# Patient Record
Sex: Female | Born: 1970
Health system: Southern US, Community
[De-identification: ages and names within clinical notes are randomized; demographics above are authoritative.]

## PROBLEM LIST (undated history)

## (undated) DIAGNOSIS — E785 Hyperlipidemia, unspecified: Secondary | ICD-10-CM

## (undated) DIAGNOSIS — R011 Cardiac murmur, unspecified: Secondary | ICD-10-CM

## (undated) HISTORY — DX: Cardiac murmur, unspecified: R01.1

## (undated) HISTORY — DX: Hyperlipidemia, unspecified: E78.5

---
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
## 1995-10-04 HISTORY — PX: TUBAL LIGATION: SHX77

## 1997-10-03 HISTORY — PX: TONSILLECTOMY: SUR1361

## 2004-04-01 ENCOUNTER — Ambulatory Visit (HOSPITAL_COMMUNITY): Admission: RE | Admit: 2004-04-01 | Discharge: 2004-04-01 | Payer: Self-pay | Admitting: Pulmonary Disease

## 2005-03-02 ENCOUNTER — Ambulatory Visit (HOSPITAL_COMMUNITY): Admission: RE | Admit: 2005-03-02 | Discharge: 2005-03-02 | Payer: Self-pay | Admitting: Pulmonary Disease

## 2007-04-07 ENCOUNTER — Emergency Department (HOSPITAL_COMMUNITY): Admission: EM | Admit: 2007-04-07 | Discharge: 2007-04-07 | Payer: Self-pay | Admitting: Emergency Medicine

## 2007-10-21 ENCOUNTER — Emergency Department (HOSPITAL_COMMUNITY): Admission: EM | Admit: 2007-10-21 | Discharge: 2007-10-21 | Payer: Self-pay | Admitting: Emergency Medicine

## 2008-10-14 ENCOUNTER — Emergency Department (HOSPITAL_COMMUNITY): Admission: EM | Admit: 2008-10-14 | Discharge: 2008-10-14 | Payer: Self-pay | Admitting: Emergency Medicine

## 2011-07-19 LAB — BASIC METABOLIC PANEL
BUN: 8
Calcium: 9.1
Chloride: 105
GFR calc Af Amer: >60
Potassium: 3.8
Sodium: 139

## 2011-07-19 LAB — POCT CARDIAC MARKERS
CKMB, poc: <1 — ABNORMAL LOW
Myoglobin, poc: 43.9
Operator id: 198161
Troponin i, poc: <0.05

## 2011-07-19 LAB — CBC
Hemoglobin: 12.2
MCHC: 35.2
RBC: 3.97
WBC: 7.1

## 2011-07-19 LAB — DIFFERENTIAL
Basophils Absolute: 0
Eosinophils Relative: 1
Lymphocytes Relative: 26
Lymphs Abs: 1.8

## 2015-08-20 ENCOUNTER — Encounter: Payer: Self-pay | Admitting: Cardiology

## 2015-08-20 ENCOUNTER — Ambulatory Visit (INDEPENDENT_AMBULATORY_CARE_PROVIDER_SITE_OTHER): Payer: PRIVATE HEALTH INSURANCE | Admitting: Cardiology

## 2015-08-20 VITALS — BP 127/83 | HR 69 | Ht 67.0 in | Wt 171.0 lb

## 2015-08-20 DIAGNOSIS — R0789 Other chest pain: Secondary | ICD-10-CM | POA: Diagnosis not present

## 2015-08-20 DIAGNOSIS — R011 Cardiac murmur, unspecified: Secondary | ICD-10-CM | POA: Diagnosis not present

## 2015-08-20 NOTE — Progress Notes (Signed)
Patient ID: Kelly Buchanan, female   DOB: 1970-11-10, 44 y.o.   MRN: [id number] .      Clinical Summary Kelly Buchanan is a 44 y.o.female seen today as a new patient for the following medical problems.   1. Chest pain - symptoms started about 1 year ago - pressure like pain left chest, can radiate down into arm. No associated symptoms. 5/10 in severity. Typically occurs with stress, not neccesarily exertional. Not exertional, no relation to food - occurs 2-3 times a week. Can last several hours and constant - notes some mild DOE with walking up stairs - no  LE edema, no orthopnea, no PND - no change in severity or frequency  CAD: HL. Father MI in 75s   [place] 1. Hyperlipidemia   No Known Allergies   No current outpatient prescriptions on file.   No current facility-administered medications for this visit.     Past Surgical History  Procedure Laterality Date  . Appendectomy  1980's  . Tonsillectomy  1999  . Tubal ligation  1997     No Known Allergies    Family History  Problem Relation Age of Onset  . Hypertension Mother   . Heart disease Father   . Heart attack Father   . Hypertension Father      Social History Kelly Buchanan reports that she has never smoked. She does not have any smokeless tobacco history on file. Kelly Buchanan has no alcohol history on file.   Review of Systems CONSTITUTIONAL: No weight loss, fever, chills, weakness or fatigue.  HEENT: Eyes: No visual loss, blurred vision, double vision or yellow sclerae.No hearing loss, sneezing, congestion, runny nose or sore throat.  SKIN: No rash or itching.  CARDIOVASCULAR: per HPI RESPIRATORY: No cough or sputum.  GASTROINTESTINAL: No anorexia, nausea, vomiting or diarrhea. No abdominal pain or blood.  GENITOURINARY: No burning on urination, no polyuria NEUROLOGICAL: No headache, dizziness, syncope, paralysis, ataxia, numbness or tingling in the extremities. No change in bowel or bladder control.    MUSCULOSKELETAL: No muscle, back pain, joint pain or stiffness.  LYMPHATICS: No enlarged nodes. No history of splenectomy.  PSYCHIATRIC: No history of depression or anxiety.  ENDOCRINOLOGIC: No reports of sweating, cold or heat intolerance. No polyuria or polydipsia.  Marland Kitchen   Physical Examination Filed Vitals:   08/20/15 1311  BP: 127/83  Pulse: 69   Filed Weights   08/20/15 1311  Weight: 171 lb (77.565 kg)    Gen: resting comfortably, no acute distress HEENT: no scleral icterus, pupils equal round and reactive, no palptable cervical adenopathy,  CV: RRR, 2/6 systolic murmur RUSB, no jvd Resp: Clear to auscultation bilaterally GI: abdomen is soft, non-tender, non-distended, normal bowel sounds, no hepatosplenomegaly MSK: extremities are warm, no edema. Left chestwall tender to palpation  Skin: warm, no rash Neuro:  no focal deficits Psych: appropriate affect   Diagnostic Studies EKG: NSR    Assessment and Plan  1. Chest pain - atypical symptoms in a young patient with limited CAD risk factors. Left chestwall is tender to palpation, suspect costochondritis - will take ibuprofen 400mg  tid x 7 days  2. Heart murmur - will obtain echo      Arnoldo Lenis, M.D.

## 2015-08-20 NOTE — Patient Instructions (Signed)
Your physician recommends that you schedule a follow-up appointment in: 2 months with Dr. Harl Bowie  Your physician has recommended you make the following change in your medication:   Kelly Buchanan physician has requested that you have an echocardiogram. Echocardiography is a painless test that uses sound waves to create images of your heart. It provides your doctor with information about the size and shape of your heart and how well your heart's chambers and valves are working. This procedure takes approximately one hour. There are no restrictions for this procedure.  Thank you for choosing [facility]!!

## 2015-09-02 ENCOUNTER — Other Ambulatory Visit: Payer: PRIVATE HEALTH INSURANCE

## 2015-09-17 ENCOUNTER — Ambulatory Visit (INDEPENDENT_AMBULATORY_CARE_PROVIDER_SITE_OTHER): Payer: PRIVATE HEALTH INSURANCE

## 2015-09-17 ENCOUNTER — Other Ambulatory Visit: Payer: Self-pay

## 2015-09-17 DIAGNOSIS — R011 Cardiac murmur, unspecified: Secondary | ICD-10-CM | POA: Diagnosis not present

## 2015-11-03 ENCOUNTER — Encounter: Payer: Self-pay | Admitting: *Deleted

## 2015-11-03 ENCOUNTER — Ambulatory Visit: Payer: PRIVATE HEALTH INSURANCE | Admitting: Cardiology

## 2015-11-03 NOTE — Progress Notes (Unsigned)
Patient ID: Kelly Buchanan, female   DOB: Oct 13, 1970, 45 y.o.   MRN: [id number]     Clinical Summary Ms. Facenda is a 45 y.o.female seen today for follow up of the following medical problems.   1. Chest pain - symptoms started about 1 year ago - pressure like pain left chest, can radiate down into arm. No associated symptoms. 5/10 in severity. Typically occurs with stress, not neccesarily exertional. Not exertional, no relation to food - occurs 2-3 times a week. Can last several hours and constant - notes some mild DOE with walking up stairs - no LE edema, no orthopnea, no PND - no change in severity or frequency  CAD: HL. Father MI in 69s      [place] 1. Hyperlipidemia Past Medical History  Diagnosis Date  . Hyperlipidemia   . Murmur      No Known Allergies   No current outpatient prescriptions on file.   No current facility-administered medications for this visit.     Past Surgical History  Procedure Laterality Date  . Appendectomy  1980's  . Tonsillectomy  1999  . Tubal ligation  1997     No Known Allergies    Family History  Problem Relation Age of Onset  . Hypertension Mother   . Heart disease Father   . Heart attack Father   . Hypertension Father      Social History Ms. Lynes reports that she has never smoked. She does not have any smokeless tobacco history on file. Ms. Shahzad has no alcohol history on file.   Review of Systems CONSTITUTIONAL: No weight loss, fever, chills, weakness or fatigue.  HEENT: Eyes: No visual loss, blurred vision, double vision or yellow sclerae.No hearing loss, sneezing, congestion, runny nose or sore throat.  SKIN: No rash or itching.  CARDIOVASCULAR:  RESPIRATORY: No shortness of breath, cough or sputum.  GASTROINTESTINAL: No anorexia, nausea, vomiting or diarrhea. No abdominal pain or blood.  GENITOURINARY: No burning on urination, no polyuria NEUROLOGICAL: No headache, dizziness, syncope, paralysis, ataxia,  numbness or tingling in the extremities. No change in bowel or bladder control.  MUSCULOSKELETAL: No muscle, back pain, joint pain or stiffness.  LYMPHATICS: No enlarged nodes. No history of splenectomy.  PSYCHIATRIC: No history of depression or anxiety.  ENDOCRINOLOGIC: No reports of sweating, cold or heat intolerance. No polyuria or polydipsia.  Marland Kitchen   Physical Examination There were no vitals filed for this visit. There were no vitals filed for this visit.  Gen: resting comfortably, no acute distress HEENT: no scleral icterus, pupils equal round and reactive, no palptable cervical adenopathy,  CV Resp: Clear to auscultation bilaterally GI: abdomen is soft, non-tender, non-distended, normal bowel sounds, no hepatosplenomegaly MSK: extremities are warm, no edema.  Skin: warm, no rash Neuro:  no focal deficits Psych: appropriate affect   Diagnostic Studies 09/2015 echo  Study Conclusions  - Left ventricle: The cavity size was normal. Wall thickness was normal. Systolic function was normal. The estimated ejection fraction was in the range of 60% to 65%. Wall motion was normal; there were no regional wall motion abnormalities. Left ventricular diastolic function parameters were normal. - Tricuspid valve: There was mild regurgitation.   Assessment and Plan   1. Chest pain - atypical symptoms in a young patient with limited CAD risk factors. Left chestwall is tender to palpation, suspect costochondritis - will take ibuprofen 400mg  tid x 7 days  2. Heart murmur - will obtain echo     Arnoldo Lenis,  M.D., F.A.C.C.

## 2015-11-23 ENCOUNTER — Ambulatory Visit: Payer: PRIVATE HEALTH INSURANCE | Admitting: Cardiology

## 2015-12-14 ENCOUNTER — Ambulatory Visit (INDEPENDENT_AMBULATORY_CARE_PROVIDER_SITE_OTHER): Payer: PRIVATE HEALTH INSURANCE | Admitting: Cardiology

## 2015-12-14 ENCOUNTER — Encounter: Payer: Self-pay | Admitting: Cardiology

## 2015-12-14 VITALS — BP 126/80 | HR 67 | Ht 67.0 in | Wt 178.0 lb

## 2015-12-14 DIAGNOSIS — R011 Cardiac murmur, unspecified: Secondary | ICD-10-CM | POA: Diagnosis not present

## 2015-12-14 DIAGNOSIS — R0789 Other chest pain: Secondary | ICD-10-CM | POA: Diagnosis not present

## 2015-12-14 MED ORDER — IBUPROFEN 400 MG PO TABS: 400.0000 mg | ORAL_TABLET | Freq: Three times a day (TID) | ORAL | Status: DC

## 2015-12-14 NOTE — Patient Instructions (Signed)
   Ibuprofen 400mg  three times per day x 7 days  Continue all other medications.   Your physician wants you to follow up in: 6 months.  You will receive a reminder letter in the mail one-two months in advance.  If you don't receive a letter, please call our office to schedule the follow up appointment

## 2015-12-14 NOTE — Progress Notes (Signed)
Patient ID: Kelly Buchanan, female   DOB: Jul 08, 1971, 45 y.o.   MRN: [id number]     Clinical Summary Ms. Huicochea is a 45 y.o.female seen today for follow up of the following medical problems.   1. Chest pain - last visit described symptoms of chest pain that started about 1 year ago - pressure like pain left chest, can radiate down into arm. No associated symptoms. 5/10 in severity. Typically occurs with stress, not neccesarily exertional. Not exertional, no relation to food - occurs 2-3 times a week. Can last several hours and constant. Last visit was reproducible to palpation.  CAD risk factors: hyperlipidemia  - since last visit symptoms are unchanged. She did not try a course of NSAIDs as recommended.    2. Heart murmur - since last visit complete echo that only showed mild TR - no SOB, no LE edema.    SH: Works as Engineer, structural in [place] History  Diagnosis Date  . Hyperlipidemia   . Murmur      No Known Allergies   No current outpatient prescriptions on file.   No current facility-administered medications for this visit.     Past Surgical History  Procedure Laterality Date  . Appendectomy  1980's  . Tonsillectomy  1999  . Tubal ligation  1997     No Known Allergies    Family History  Problem Relation Age of Onset  . Hypertension Mother   . Heart disease Father   . Heart attack Father   . Hypertension Father      Social History Ms. Vanderkamp reports that she has never smoked. She does not have any smokeless tobacco history on file. Ms. Weichert has no alcohol history on file.   Review of Systems CONSTITUTIONAL: No weight loss, fever, chills, weakness or fatigue.  HEENT: Eyes: No visual loss, blurred vision, double vision or yellow sclerae.No hearing loss, sneezing, congestion, runny nose or sore throat.  SKIN: No rash or itching.  CARDIOVASCULAR: per HPI RESPIRATORY: No shortness of breath, cough or sputum.  GASTROINTESTINAL: No  anorexia, nausea, vomiting or diarrhea. No abdominal pain or blood.  GENITOURINARY: No burning on urination, no polyuria NEUROLOGICAL: No headache, dizziness, syncope, paralysis, ataxia, numbness or tingling in the extremities. No change in bowel or bladder control.  MUSCULOSKELETAL: No muscle, back pain, joint pain or stiffness.  LYMPHATICS: No enlarged nodes. No history of splenectomy.  PSYCHIATRIC: No history of depression or anxiety.  ENDOCRINOLOGIC: No reports of sweating, cold or heat intolerance. No polyuria or polydipsia.  Marland Kitchen   Physical Examination Filed Vitals:   12/14/15 0846  BP: 126/80  Pulse: 67   Filed Vitals:   12/14/15 0846  Height: 5\' 7"  (1.702 m)  Weight: 178 lb (80.74 kg)    Gen: resting comfortably, no acute distress HEENT: no scleral icterus, pupils equal round and reactive, no palptable cervical adenopathy,  CV: RRR, 2/6 systolic murmur LLSB, no jvd Resp: Clear to auscultation bilaterally GI: abdomen is soft, non-tender, non-distended, normal bowel sounds, no hepatosplenomegaly MSK: extremities are warm, no edema.  Skin: warm, no rash Neuro:  no focal deficits Psych: appropriate affect   Diagnostic Studies  09/2015 echo Study Conclusions  - Left ventricle: The cavity size was normal. Wall thickness was  normal. Systolic function was normal. The estimated ejection  fraction was in the range of 60% to 65%. Wall motion was normal;  there were no regional wall motion abnormalities. Left  ventricular diastolic function parameters were normal. -  Tricuspid valve: There was mild regurgitation.   Assessment and Plan  1. Chest pain - atypical symptoms in a young patient with limited CAD risk factors. Last visit pain was reproducible to palpation. Likely MSK pain,she did not try a course of NSAIDs as recommended. Continue to recommend ibuprofen 400mg  tid x 7 days - no further cardiac workup at this time.   2. Heart murmur - echo with fairly benign  finding of mild TR, continue to monitor.    F/u 6 months   Arnoldo Lenis, M.D.

## 2016-01-27 ENCOUNTER — Other Ambulatory Visit (HOSPITAL_COMMUNITY): Payer: Self-pay | Admitting: Family

## 2016-01-27 DIAGNOSIS — Z1231 Encounter for screening mammogram for malignant neoplasm of breast: Secondary | ICD-10-CM

## 2016-02-01 ENCOUNTER — Ambulatory Visit (HOSPITAL_COMMUNITY)
Admission: RE | Admit: 2016-02-01 | Discharge: 2016-02-01 | Disposition: A | Discharge status: Home / Self Care | Payer: PRIVATE HEALTH INSURANCE | Source: Ambulatory Visit | Attending: Family | Admitting: Family

## 2016-02-01 DIAGNOSIS — Z1231 Encounter for screening mammogram for malignant neoplasm of breast: Secondary | ICD-10-CM | POA: Diagnosis not present

## 2016-02-18 ENCOUNTER — Ambulatory Visit (INDEPENDENT_AMBULATORY_CARE_PROVIDER_SITE_OTHER): Payer: PRIVATE HEALTH INSURANCE | Admitting: Podiatry

## 2016-02-18 ENCOUNTER — Encounter: Payer: Self-pay | Admitting: Podiatry

## 2016-02-18 ENCOUNTER — Ambulatory Visit (INDEPENDENT_AMBULATORY_CARE_PROVIDER_SITE_OTHER): Payer: PRIVATE HEALTH INSURANCE

## 2016-02-18 VITALS — BP 113/75 | HR 68 | Resp 16 | Ht 67.0 in | Wt 175.0 lb

## 2016-02-18 DIAGNOSIS — M79672 Pain in left foot: Secondary | ICD-10-CM | POA: Diagnosis not present

## 2016-02-18 DIAGNOSIS — M722 Plantar fascial fibromatosis: Secondary | ICD-10-CM | POA: Diagnosis not present

## 2016-02-18 DIAGNOSIS — L309 Dermatitis, unspecified: Secondary | ICD-10-CM | POA: Diagnosis not present

## 2016-02-18 MED ORDER — TERBINAFINE HCL 250 MG PO TABS: 250.0000 mg | ORAL_TABLET | Freq: Every day | ORAL | Status: DC

## 2016-02-18 MED ORDER — TRIAMCINOLONE ACETONIDE 10 MG/ML IJ SUSP
10.0000 mg | Freq: Once | INTRAMUSCULAR | Status: AC
  Administered 2016-02-18: 10 mg

## 2016-02-18 NOTE — Progress Notes (Signed)
   Subjective:    Patient ID: Kelly Buchanan, female    DOB: Feb 12, 1971, 45 y.o.   MRN: [id number]  HPI Chief Complaint  Patient presents with  . Foot Pain    Left foot; arch; x1 yr  . Skin Condition    Bilateral; pt stated, "Has dry scaly skin; mainly more on Left foot"; x1 yr      Review of Systems  All other systems reviewed and are negative.      Objective:   Physical Exam        Assessment & Plan:

## 2016-02-18 NOTE — Progress Notes (Signed)
Subjective:     Patient ID: Kelly Buchanan, female   DOB: 1970/12/21, 45 y.o.   MRN: [id number]  HPI patient points to left arch states it's been bothering her for several months and also has discoloration of the left plantar foot with dry skin with minimal on the right foot   Review of Systems  All other systems reviewed and are negative.      Objective:   Physical Exam  Constitutional: She is oriented to person, place, and time.  Cardiovascular: Intact distal pulses.   Musculoskeletal: Normal range of motion.  Neurological: She is oriented to person, place, and time.  Skin: Skin is warm.  Nursing note and vitals reviewed.  neurovascular status intact muscle strength adequate with patient found to have exquisite mid arch pain left arch with fluid buildup within the mid arch area. The patient's left foot has significant dry skin plantarly that's localized in nature with minimal on the right and does not remember specific problem that may have occurred. Good digital perfusion well oriented 3     Assessment:     Possibility for fungus type treatment or dermatitis along with inflammatory pain in the left plantar fascia mid arch    Plan:     H&P and x-rays reviewed. Injected mid arch left 3 mg Kenalog 5 mill grams Xylocaine advised on Vaseline Saran wrap usage and placed on oral Lamisil to her 50 mg 45 days with instructions to stop and if it should cause any issues  Raise indicated that there is a well-developed arch with no spurring or signs of stress fracture

## 2016-02-18 NOTE — Patient Instructions (Signed)

## 2016-03-11 ENCOUNTER — Ambulatory Visit (INDEPENDENT_AMBULATORY_CARE_PROVIDER_SITE_OTHER): Payer: PRIVATE HEALTH INSURANCE | Admitting: Podiatry

## 2016-03-11 ENCOUNTER — Encounter: Payer: Self-pay | Admitting: Podiatry

## 2016-03-11 VITALS — BP 121/76 | HR 62 | Resp 12

## 2016-03-11 DIAGNOSIS — M722 Plantar fascial fibromatosis: Secondary | ICD-10-CM | POA: Diagnosis not present

## 2016-03-11 NOTE — Progress Notes (Signed)
Subjective:     Patient ID: Kelly Buchanan, female   DOB: 02/14/1971, 45 y.o.   MRN: [id number]  HPI patient states my left arch is feeling quite a bit better with mild discomfort still noted and I still have dry skin   Review of Systems     Objective:   Physical Exam Neurovascular status intact muscle strength adequate with continued dry skin formation and improvement of the left arch with palpation with slight pain still noted upon deep palpation    Assessment:     Probable fungal infection still noted with significant improvement of the plantar fascial left    Plan:     Advised on physical therapy anti-inflammatories and went ahead and discussed shoe gear modifications. Continue oral Lamisil and will be seen back as needed

## 2016-09-05 ENCOUNTER — Ambulatory Visit: Payer: PRIVATE HEALTH INSURANCE | Admitting: Family Medicine

## 2016-09-06 ENCOUNTER — Ambulatory Visit (INDEPENDENT_AMBULATORY_CARE_PROVIDER_SITE_OTHER): Payer: PRIVATE HEALTH INSURANCE | Admitting: Family Medicine

## 2016-09-06 ENCOUNTER — Encounter: Payer: Self-pay | Admitting: Family Medicine

## 2016-09-06 VITALS — BP 134/86 | HR 72 | Temp 98.8°F | Resp 16 | Ht 67.0 in | Wt 176.0 lb

## 2016-09-06 DIAGNOSIS — L74510 Primary focal hyperhidrosis, axilla: Secondary | ICD-10-CM

## 2016-09-06 DIAGNOSIS — E785 Hyperlipidemia, unspecified: Secondary | ICD-10-CM | POA: Diagnosis not present

## 2016-09-06 DIAGNOSIS — Z23 Encounter for immunization: Secondary | ICD-10-CM | POA: Diagnosis not present

## 2016-09-06 DIAGNOSIS — R011 Cardiac murmur, unspecified: Secondary | ICD-10-CM | POA: Diagnosis not present

## 2016-09-06 DIAGNOSIS — Z7689 Persons encountering health services in other specified circumstances: Secondary | ICD-10-CM

## 2016-09-06 HISTORY — DX: Primary focal hyperhidrosis, axilla: L74.510

## 2016-09-06 LAB — CBC
HCT: 38.5 % (ref 35.0–45.0)
Hemoglobin: 13 g/dL (ref 11.7–15.5)
MCH: 29.8 pg (ref 27.0–33.0)
MCHC: 33.8 g/dL (ref 32.0–36.0)
MCV: 88.3 fL (ref 80.0–100.0)
MPV: 8.3 fL (ref 7.5–12.5)
PLATELETS: 314 10*3/uL (ref 140–400)
RBC: 4.36 MIL/uL (ref 3.80–5.10)
RDW: 13.8 % (ref 11.0–15.0)
WBC: 5.2 10*3/uL (ref 3.8–10.8)

## 2016-09-06 LAB — COMPLETE METABOLIC PANEL WITH GFR
ALK PHOS: 125 U/L — AB (ref 33–115)
ALT: 14 U/L (ref 6–29)
AST: 13 U/L (ref 10–30)
Albumin: 4.4 g/dL (ref 3.6–5.1)
BUN: 9 mg/dL (ref 7–25)
CHLORIDE: 103 mmol/L (ref 98–110)
CO2: 29 mmol/L (ref 20–31)
Calcium: 9.3 mg/dL (ref 8.6–10.2)
Creat: 0.76 mg/dL (ref 0.50–1.10)
GFR, Est African American: >89 mL/min (ref >=60)
GLUCOSE: 84 mg/dL (ref 65–99)
POTASSIUM: 3.7 mmol/L (ref 3.5–5.3)
SODIUM: 138 mmol/L (ref 135–146)
Total Bilirubin: 0.5 mg/dL (ref 0.2–1.2)
Total Protein: 7.1 g/dL (ref 6.1–8.1)

## 2016-09-06 LAB — LIPID PANEL
CHOL/HDL RATIO: 4.1 ratio (ref <5.0)
Cholesterol: 224 mg/dL — ABNORMAL HIGH (ref <200)
HDL: 55 mg/dL (ref >50)
LDL CALC: 147 mg/dL — AB (ref <100)
Triglycerides: 109 mg/dL (ref <150)
VLDL: 22 mg/dL (ref <30)

## 2016-09-06 LAB — URINALYSIS, ROUTINE W REFLEX MICROSCOPIC
BILIRUBIN URINE: NEGATIVE
Glucose, UA: NEGATIVE
Hgb urine dipstick: NEGATIVE
KETONES UR: NEGATIVE
Leukocytes, UA: NEGATIVE
Nitrite: NEGATIVE
Protein, ur: NEGATIVE
SPECIFIC GRAVITY, URINE: 1.017 (ref 1.001–1.035)
pH: 5 (ref 5.0–8.0)

## 2016-09-06 MED ORDER — ALUMINUM CHLORIDE 20 % EX SOLN: Freq: Every day | CUTANEOUS | 0 refills | Status: DC

## 2016-09-06 NOTE — Patient Instructions (Signed)
Get labs today I will send you a letter with your test results.  If there is anything of concern, we will call right away.  Continue to eat well and exercise  See me yearly

## 2016-09-06 NOTE — Progress Notes (Signed)
Chief Complaint  Patient presents with  . Establish Care   New to establish Pap and mammogram this year Sees eye doctor every year Dentist every 6 months Works as Engineer, structural No health concerns  Patient Active Problem List   Diagnosis Date Noted  . Heart murmur 09/06/2016  . Hyperlipidemia 09/06/2016  . Hyperhidrosis of axilla 09/06/2016    Outpatient Encounter Prescriptions as of 09/06/2016  Medication Sig  . aluminum chloride (DRYSOL) 20 % external solution Apply topically at bedtime.  . [DISCONTINUED] ibuprofen (ADVIL,MOTRIN) 400 MG tablet Take 1 tablet (400 mg total) by mouth 3 (three) times daily. X 7 days (Patient not taking: Reported on 09/06/2016)  . [DISCONTINUED] lovastatin (MEVACOR) 20 MG tablet Take 20 mg by mouth as needed.   . [DISCONTINUED] terbinafine (LAMISIL) 250 MG tablet Take 1 tablet (250 mg total) by mouth daily. (Patient not taking: Reported on 09/06/2016)   No facility-administered encounter medications on file as of 09/06/2016.     Past Medical History:  Diagnosis Date  . Hyperlipidemia   . Murmur     Past Surgical History:  Procedure Laterality Date  . APPENDECTOMY  1980's  . TONSILLECTOMY  1999  . TUBAL LIGATION  1997    Social History   Social History  . Marital status: Single    Spouse name: N/A  . Number of children: 4  . Years of education: 14   Occupational History  . police officer [employer]   Social History Main Topics  . Smoking status: Never Smoker  . Smokeless tobacco: Never Used  . Alcohol use No  . Drug use: No  . Sexual activity: Not Currently    Birth control/ protection: Surgical   Other Topics Concern  . Not on file   Social History Narrative   Lives with 10 yo daughter   police officer    Family History  Problem Relation Age of Onset  . Hypertension Mother   . Heart disease Father 57    died at 27  . Heart attack Father   . Hypertension Father   . Alcohol abuse Father   . Cancer Sister      hodgkins  . Cancer Maternal Aunt     Review of Systems  Constitutional: Negative for chills, fever and weight loss.  HENT: Negative for congestion and hearing loss.   Eyes: Negative for blurred vision and pain.  Respiratory: Negative for cough and shortness of breath.   Cardiovascular: Negative for chest pain and leg swelling.  Gastrointestinal: Negative for abdominal pain, constipation, diarrhea and heartburn.  Genitourinary: Negative for dysuria and frequency.  Musculoskeletal: Negative for falls, joint pain and myalgias.  Neurological: Negative for dizziness, seizures and headaches.  Psychiatric/Behavioral: Negative for depression. The patient is not nervous/anxious and does not have insomnia.     BP 134/86 (BP Location: Right Arm, Patient Position: Sitting, Cuff Size: Normal)   Pulse 72   Temp 98.8 F (37.1 C) (Oral)   Resp 16   Ht 5\' 7"  (1.702 m)   Wt 176 lb (79.8 kg)   LMP 09/02/2016 (Exact Date)   SpO2 99%   BMI 27.57 kg/m   Physical Exam  Constitutional: She is oriented to person, place, and time. She appears well-developed and well-nourished.  HENT:  Head: Normocephalic and atraumatic.  Right Ear: External ear normal.  Left Ear: External ear normal.  Mouth/Throat: Oropharynx is clear and moist.  Eyes: Conjunctivae are normal. Pupils are equal, round, and reactive to light.  Neck: Normal range of motion. Neck supple. No thyromegaly present.  Cardiovascular: Normal rate, regular rhythm and normal heart sounds.   No murmur  Pulmonary/Chest: Effort normal and breath sounds normal. No respiratory distress.  Abdominal: Soft. Bowel sounds are normal.  Musculoskeletal: Normal range of motion. She exhibits no edema.  Lymphadenopathy:    She has no cervical adenopathy.  Neurological: She is alert and oriented to person, place, and time.  Gait normal  Skin: Skin is warm and dry.  Psychiatric: She has a normal mood and affect. Her behavior is normal. Thought content  normal.  Nursing note and vitals reviewed.  ASSESSMENT/PLAN:  1. Heart murmur   2. Hyperlipidemia, unspecified hyperlipidemia type  - CBC - COMPLETE METABOLIC PANEL WITH GFR - Lipid panel - Urinalysis, Routine w reflex microscopic (not at [facility]) - VITAMIN D 25 Hydroxy (Vit-D Deficiency, Fractures)  3. Hyperhidrosis of axilla   4. Need for prophylactic vaccination and inoculation against influenza   5. Need for Tdap vaccination     Patient Instructions  Get labs today I will send you a letter with your test results.  If there is anything of concern, we will call right away.  Continue to eat well and exercise  See me yearly   Raylene Everts, MD

## 2016-09-07 ENCOUNTER — Encounter: Payer: Self-pay | Admitting: Family Medicine

## 2016-09-07 DIAGNOSIS — E559 Vitamin D deficiency, unspecified: Secondary | ICD-10-CM | POA: Insufficient documentation

## 2016-09-07 LAB — VITAMIN D 25 HYDROXY (VIT D DEFICIENCY, FRACTURES): VIT D 25 HYDROXY: 9 ng/mL — AB (ref 30–100)

## 2016-09-07 MED ORDER — VITAMIN D (ERGOCALCIFEROL) 1.25 MG (50000 UNIT) PO CAPS: 50000.0000 [IU] | ORAL_CAPSULE | ORAL | 0 refills | Status: DC

## 2016-12-26 ENCOUNTER — Ambulatory Visit (INDEPENDENT_AMBULATORY_CARE_PROVIDER_SITE_OTHER): Payer: PRIVATE HEALTH INSURANCE | Admitting: Family Medicine

## 2016-12-26 ENCOUNTER — Encounter: Payer: Self-pay | Admitting: Family Medicine

## 2016-12-26 VITALS — BP 136/86 | HR 80 | Temp 98.5°F | Resp 16 | Ht 67.0 in | Wt 180.1 lb

## 2016-12-26 DIAGNOSIS — E559 Vitamin D deficiency, unspecified: Secondary | ICD-10-CM | POA: Diagnosis not present

## 2016-12-26 DIAGNOSIS — R635 Abnormal weight gain: Secondary | ICD-10-CM | POA: Diagnosis not present

## 2016-12-26 DIAGNOSIS — R4586 Emotional lability: Secondary | ICD-10-CM | POA: Diagnosis not present

## 2016-12-26 DIAGNOSIS — N951 Menopausal and female climacteric states: Secondary | ICD-10-CM | POA: Diagnosis not present

## 2016-12-26 NOTE — Patient Instructions (Signed)
Walk every day that you are able Eat well balance diet Try to get regular sleep Call in for better in a couple of weeks I will call you with your results for the next step

## 2016-12-26 NOTE — Progress Notes (Signed)
Chief Complaint  Patient presents with  . mood changes  emotional labilty Fatigue Less enjoyment in life Cant leave the house Didn't go to work this weekend Cries all the time No change in weight or appetite No pain or headache or somatic symptom.  Regular menses. Never treated depression before Had alcoholic father who was abusive to mother, brother went to prison for drugs, she is a single mom of 4 girls, has a hard/demanding job as a Engineer, structural, Enough reason for dysthymia   Patient Active Problem List   Diagnosis Date Noted  . Vitamin D deficiency 09/07/2016  . Heart murmur 09/06/2016  . Hyperlipidemia 09/06/2016  . Hyperhidrosis of axilla 09/06/2016    Outpatient Encounter Prescriptions as of 12/26/2016  Medication Sig  . aluminum chloride (DRYSOL) 20 % external solution Apply topically at bedtime.  . Vitamin D, Ergocalciferol, (DRISDOL) 50000 units CAPS capsule Take 1 capsule (50,000 Units total) by mouth every 7 (seven) days.   No facility-administered encounter medications on file as of 12/26/2016.     No Known Allergies  Review of Systems  Constitutional: Negative for activity change, appetite change and unexpected weight change.  HENT: Negative for congestion, dental problem, postnasal drip and rhinorrhea.   Eyes: Negative for redness and visual disturbance.  Respiratory: Negative for cough and shortness of breath.   Cardiovascular: Negative for chest pain, palpitations and leg swelling.  Gastrointestinal: Negative for abdominal pain, constipation and diarrhea.  Genitourinary: Negative for difficulty urinating, frequency and menstrual problem.  Musculoskeletal: Negative for arthralgias and back pain.  Neurological: Negative for dizziness and headaches.  Psychiatric/Behavioral: Positive for decreased concentration and dysphoric mood. Negative for self-injury, sleep disturbance and suicidal ideas. The patient is not nervous/anxious.     BP 136/86 (BP  Location: Right Arm, Patient Position: Sitting, Cuff Size: Normal)   Pulse 80   Temp 98.5 F (36.9 C) (Temporal)   Resp 16   Ht 5\' 7"  (1.702 m)   Wt 180 lb 1.9 oz (81.7 kg)   LMP 12/21/2016 (Exact Date)   SpO2 99%   BMI 28.21 kg/m   Physical Exam  Constitutional: She is oriented to person, place, and time. She appears well-developed and well-nourished.  HENT:  Head: Normocephalic and atraumatic.  Mouth/Throat: Oropharynx is clear and moist.  Eyes: Conjunctivae are normal. Pupils are equal, round, and reactive to light.  Neck: Normal range of motion. Neck supple. Thyromegaly present.  Cardiovascular: Normal rate, regular rhythm and normal heart sounds.   Pulmonary/Chest: Effort normal and breath sounds normal. No respiratory distress.  Musculoskeletal: Normal range of motion. She exhibits no edema.  Lymphadenopathy:    She has no cervical adenopathy.  Neurological: She is alert and oriented to person, place, and time. She displays normal reflexes.  Gait normal  Skin: Skin is warm and dry.  Psychiatric: Her behavior is normal. Thought content normal.  tearful  Nursing note and vitals reviewed.   ASSESSMENT/PLAN:  1. Lability emotional  - TSH - [facility] - LH - Vitamin D (25 hydroxy)  2. Weight gain finding  - TSH - [facility] - LH - Vitamin D (25 hydroxy)  3. Menopausal symptoms  - TSH - [facility] - LH - Vitamin D (25 hydroxy)  4. Vitamin D deficiency  - TSH - [facility] - LH - Vitamin D (25 hydroxy)  Discussed likely diagnosis of depression.  Discussed use of SSRI medicines and expectations.  Discussed sleep/ exercise/ eating well and caring for self.  Try yoga or meditation.  I will check labs and call her tomorrow.  Likely prescribe a SSRI. Patient Instructions  Walk every day that you are able Eat well balance diet Try to get regular sleep Call in for better in a couple of weeks I will call you with your results for the next step   Raylene Everts, MD

## 2016-12-27 ENCOUNTER — Other Ambulatory Visit: Payer: Self-pay | Admitting: Family Medicine

## 2016-12-27 LAB — TSH: TSH: 1.52 m[IU]/L

## 2016-12-27 LAB — VITAMIN D 25 HYDROXY (VIT D DEFICIENCY, FRACTURES): Vit D, 25-Hydroxy: 18 ng/mL — ABNORMAL LOW (ref 30–100)

## 2016-12-27 LAB — LUTEINIZING HORMONE: LH: 6.6 m[IU]/mL

## 2016-12-27 LAB — FOLLICLE STIMULATING HORMONE: FSH: 8.9 m[IU]/mL

## 2016-12-27 MED ORDER — DULOXETINE HCL 30 MG PO CPEP: 30.0000 mg | ORAL_CAPSULE | Freq: Every day | ORAL | 1 refills | Status: DC

## 2016-12-27 MED ORDER — VITAMIN D (ERGOCALCIFEROL) 1.25 MG (50000 UNIT) PO CAPS: 50000.0000 [IU] | ORAL_CAPSULE | ORAL | 0 refills | Status: DC

## 2017-04-25 ENCOUNTER — Ambulatory Visit (INDEPENDENT_AMBULATORY_CARE_PROVIDER_SITE_OTHER): Payer: PRIVATE HEALTH INSURANCE | Admitting: Family Medicine

## 2017-04-25 ENCOUNTER — Encounter: Payer: Self-pay | Admitting: Family Medicine

## 2017-04-25 ENCOUNTER — Ambulatory Visit (HOSPITAL_COMMUNITY)
Admission: RE | Admit: 2017-04-25 | Discharge: 2017-04-25 | Disposition: A | Discharge status: Home / Self Care | Payer: PRIVATE HEALTH INSURANCE | Source: Ambulatory Visit | Attending: Family Medicine | Admitting: Family Medicine

## 2017-04-25 VITALS — BP 126/72 | HR 84 | Temp 98.3°F | Resp 16 | Ht 67.0 in | Wt 180.1 lb

## 2017-04-25 DIAGNOSIS — M545 Low back pain: Secondary | ICD-10-CM | POA: Diagnosis present

## 2017-04-25 DIAGNOSIS — M4046 Postural lordosis, lumbar region: Secondary | ICD-10-CM | POA: Diagnosis not present

## 2017-04-25 DIAGNOSIS — G8929 Other chronic pain: Secondary | ICD-10-CM | POA: Diagnosis not present

## 2017-04-25 MED ORDER — NAPROXEN 500 MG PO TABS: 500.0000 mg | ORAL_TABLET | Freq: Two times a day (BID) | ORAL | 0 refills | Status: DC

## 2017-04-25 MED ORDER — CYCLOBENZAPRINE HCL 5 MG PO TABS: 5.0000 mg | ORAL_TABLET | Freq: Every day | ORAL | 1 refills | Status: DC

## 2017-04-25 NOTE — Progress Notes (Signed)
Chief Complaint  Patient presents with  . 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 Vitamin D deficiency 09/07/2016  . Heart murmur 09/06/2016  . Hyperlipidemia 09/06/2016  . Hyperhidrosis of axilla 09/06/2016    Outpatient Encounter Prescriptions as of 04/25/2017  Medication Sig  . cholecalciferol (VITAMIN D) 1000 units tablet Take 2,000 Units by mouth daily.  . cyclobenzaprine (FLEXERIL) 5 MG tablet Take 1 tablet (5 mg total) by mouth at bedtime. As needed muscle spasm  . naproxen (NAPROSYN) 500 MG tablet Take 1 tablet (500 mg total) by mouth 2 (two) times daily with a meal.   No facility-administered encounter medications on file as of 04/25/2017.     No Known Allergies  Review of Systems  Constitutional: Negative for activity change, appetite change and unexpected weight change.  HENT: Negative for congestion, dental problem, postnasal drip and rhinorrhea.   Eyes: Negative for redness and visual disturbance.  Respiratory: Negative for cough and shortness of breath.   Cardiovascular: Negative for chest pain, palpitations and leg swelling.  Gastrointestinal: Negative for abdominal pain, constipation and diarrhea.  Genitourinary: Negative for difficulty urinating, frequency and menstrual problem.  Musculoskeletal: Positive for back pain. Negative for arthralgias.  Neurological: Negative for dizziness and headaches.  Psychiatric/Behavioral: Negative for dysphoric mood and sleep disturbance. The patient is not nervous/anxious.     BP 126/72 (BP Location: Right Arm, Patient Position: Sitting, Cuff Size: Normal)    Pulse 84   Temp 98.3 F (36.8 C) (Temporal)   Resp 16   Ht 5\' 7"  (1.702 m)   Wt 180 lb 1.3 oz (81.7 kg)   LMP 04/18/2017 Comment: tubal ligation   SpO2 100%   BMI 28.20 kg/m   Physical Exam  Constitutional: She is oriented to person, place, and time. She appears well-developed and well-nourished.  HENT:  Head: Normocephalic and atraumatic.  Mouth/Throat: Oropharynx is clear and moist.  Eyes: Pupils are equal, round, and reactive to light. Conjunctivae are normal.  Neck: Normal range of motion. Neck supple. No thyromegaly present.  Cardiovascular: Normal rate, regular rhythm and normal heart sounds.   Pulmonary/Chest: Effort normal and breath sounds normal. No respiratory distress.  Abdominal: Soft. Bowel sounds are normal.  Musculoskeletal: Normal range of motion. She exhibits no edema.  Lumbar spine is straight and symmetric. Full range of motion. Mild midline tenderness lumbar and sacrum without muscle spasm. Strength, sensation, range of motion, and reflexes are normal in both lower extremities. Straight leg raise is negative bilateral.   Lymphadenopathy:    She has no cervical adenopathy.  Neurological: She is alert and oriented to person, place, and time.  Gait normal  Skin: Skin is warm and dry.  Psychiatric: She has a normal mood and affect. Her behavior is normal. Thought content normal.  Nursing note and vitals reviewed.   ASSESSMENT/PLAN:  1. Chronic midline low back pain without sciatica Likely mechanical back pian - DG Lumbar Spine Complete; Future - Ambulatory referral to Physical Therapy   Patient Instructions  Get your x rays today Start PT Take the naproxen twice a day Take the flexeril  as needed Ice or heat as needed Off work the rest of the week  See me in 2-3 weeks Call sooner for problems   Raylene Everts, MD

## 2017-04-25 NOTE — Patient Instructions (Addendum)
Get your x rays today Start PT Take the naproxen twice a day Take the flexeril as needed Ice or heat as needed Off work the rest of the week  See me in 2-3 weeks Call sooner for problems

## 2017-05-04 ENCOUNTER — Ambulatory Visit (HOSPITAL_COMMUNITY): Payer: PRIVATE HEALTH INSURANCE | Attending: Family Medicine

## 2017-05-04 ENCOUNTER — Encounter (HOSPITAL_COMMUNITY): Payer: Self-pay

## 2017-05-04 DIAGNOSIS — R293 Abnormal posture: Secondary | ICD-10-CM | POA: Diagnosis present

## 2017-05-04 DIAGNOSIS — M6281 Muscle weakness (generalized): Secondary | ICD-10-CM | POA: Diagnosis present

## 2017-05-04 DIAGNOSIS — M545 Low back pain: Secondary | ICD-10-CM | POA: Diagnosis present

## 2017-05-04 DIAGNOSIS — G8929 Other chronic pain: Secondary | ICD-10-CM | POA: Diagnosis present

## 2017-05-04 DIAGNOSIS — R29898 Other symptoms and signs involving the musculoskeletal system: Secondary | ICD-10-CM | POA: Diagnosis present

## 2017-05-04 NOTE — Patient Instructions (Signed)
  SINGLE KNEE TO CHEST STRETCH - [place]  While Lying on your back, hold your knee and gently pull it up towards your chest.  Perform 1x/day, 3-5 stretches holding for 30-60 seconds   DOUBLE KNEE TO CHEST STRETCH - DKTC  While Lying on your back,  hold your knees and gently pull them up towards your chest  Perform 1x/day, 3-5 stretches holding for 30-60 seconds   CHILD POSE - PRAYER STRETCH  While in a crawl position, slowly lower your buttocks towards your feet until a stretch is felt along your back and or buttocks.  Perform 1x/day, 3-5 stretches holding for 30-60 seconds   LOOPED ELASTIC BAND HIP ABDUCTION  While standing with an elastic band looped around your ankles, move the target leg out to the side as shown.  Perform 1x/day, 2-3 sets of 10 with green band

## 2017-05-04 NOTE — Therapy (Signed)
[facility] [place], [place], [address] Phone: [phone]   Fax:  [phone]  Physical Therapy Evaluation  Patient Details  Name: Kelly Buchanan MRN: [id number] Date of Birth: 1971-07-31 Referring Provider: Raylene Everts, MD  Encounter Date: 05/04/2017      PT End of Session - 05/04/17 1558    Visit Number 1   Number of Visits 13   Date for PT Re-Evaluation 06/15/17   Authorization Type [employer]   Authorization Time Period 05/04/17 to 06/01/17   PT Start Time 1518   PT Stop Time 1556   PT Time Calculation (min) 38 min   Activity Tolerance Patient tolerated treatment well;No increased pain   Behavior During Therapy WFL for tasks assessed/performed      Past Medical History:  Diagnosis Date  . Hyperlipidemia   . Murmur     Past Surgical History:  Procedure Laterality Date  . APPENDECTOMY  1980's  . TONSILLECTOMY  1999  . TUBAL LIGATION  1997    There were no vitals filed for this visit.       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 She's been having this LBP for 4-5 months but this most recent spasm was the worst. She states that her duty belt is at least 8-9#. She states that she had 1 bout of LLE radicular symptoms a few weeks ago, but she has not had it since. 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 She says that standing up from sitting down and squatting down aggravate her pain sometimes. Her medication and stretching help her feel better. She states her pain is worse after she has been up doing stuff for a long time.   Limitations Sitting   How long can you sit comfortably? 45 mins and has to get out of the car and stretch   How long  can you stand comfortably? 30 mins   How long can you walk comfortably? unsure, doesn't do much walking   Patient Stated Goals get back straight if she can   Currently in Pain? No/denies            [facility] PT Assessment - 05/04/17 0001      Assessment   Medical Diagnosis chronic mid-line LBP without sciatica   Referring Provider Raylene Everts, MD   Onset Date/Surgical Date 12/02/16   Next MD Visit --  no f/u   Prior Therapy none     Precautions   Precautions None     Restrictions   Weight Bearing Restrictions No     Balance Screen   Has the patient fallen in the past 6 months No   Has the patient had a decrease in activity level because of a fear of falling?  No   Is the patient reluctant to leave their home because of a fear of falling?  No     Prior Function   Level of Independence Independent     Observation/Other Assessments   Focus on Therapeutic Outcomes (FOTO)  51% limitation     Sensation   Light Touch Appears Intact     Posture/Postural Control   Posture/Postural Control  Postural limitations   Postural Limitations Rounded Shoulders;Increased thoracic kyphosis;Decreased lumbar lordosis     ROM / Strength   AROM / PROM / Strength AROM;Strength     AROM   AROM Assessment Site Lumbar   Lumbar Flexion WNL, non-painful   Lumbar Extension min limitations, pain at end range   Lumbar - Right Side Bend WNL, pulling on L   Lumbar - Left Side Bend WNL, pulling on R    Lumbar - Right Rotation WNL   Lumbar - Left Rotation WNL     Strength   Right Hip Flexion 5/5   Right Hip Extension 4/5   Right Hip ABduction 5/5   Left Hip Flexion 5/5   Left Hip Extension 4/5   Left Hip ABduction 5/5   Right Knee Flexion 5/5   Right Knee Extension 5/5   Left Knee Flexion 5/5   Left Knee Extension 5/5   Right Ankle Dorsiflexion 5/5   Left Ankle Dorsiflexion 5/5     Flexibility   Soft Tissue Assessment /Muscle Length yes   Hamstrings WNL, no LBP   Quadriceps L +  Ely's   Piriformis mild tightness BLE     Palpation   Spinal mobility L3-5 tender to Grade 2-3 CPAs, with L5 being the greatest, recreated pt's LBP; pt reported improved symptoms with prolonged bout at each segment   Palpation comment increased soft tissue restrictions and tenderness to palpation of bil lumbar paraspinals, L>R, recreated pt's LBP     Special Tests    Special Tests Leg LengthTest   Leg length test  True     True   Length inches   Right 37 in.   Left  37 in.   Comments negative     Ambulation/Gait   Ambulation Distance (Feet) 50 Feet  in clinic for gait analysis   Assistive device None   Gait Pattern Step-through pattern;Trendelenburg;Lateral trunk lean to left   Gait Comments Pt had noticeable trendelenberg LLE when in stance phase of gait, which caused her to have a lateral trunk lean to the L for compensation. Otherwise, gait WNL     Balance   Balance Assessed Yes     Static Standing Balance   Static Standing - Balance Support No upper extremity supported   Static Standing Balance -  Activities  Single Leg Stance - Right Leg;Single Leg Stance - Left Leg   Static Standing - Comment/# of Minutes R: 30 sec L: 2 sec or <         Objective measurements completed on examination: See above findings.          PT Education - 05/04/17 1558    Education provided Yes   Education Details exam findings, POC, HEP   Person(s) Educated Patient   Methods Explanation;Demonstration;Handout   Comprehension Verbalized understanding;Returned demonstration          PT Short Term Goals - 05/04/17 1621      PT SHORT TERM GOAL #1   Title Pt will be independent with HEP and perform consistently in order to maximize return to PLOF and decrease risk of reinjury.   Time 3   Period Weeks   Status New   Target Date 05/25/17     PT SHORT TERM GOAL #2   Title Pt will have improved lumbar ext AROM to WNL and without pain to maximize overall function.   Time 3   Period  Weeks   Status New     PT SHORT TERM  GOAL #3   Title Pt will have improved L hip IR to WNL to decrease pain and improve overall function.   Time 3   Period Weeks   Status New           PT Long Term Goals - 05/04/17 1625      PT LONG TERM GOAL #1   Title Pt will have improved tolerance to sitting to at least 2 hours with minimal to no pain in order to maximize work function.   Time 6   Period Weeks   Status New   Target Date 06/15/17     PT LONG TERM GOAL #2   Title Pt will be able to maintain L SLS for 15 sec or > to maximize gait.   Time 6   Period Weeks   Status New     PT LONG TERM GOAL #3   Title Pt will demonstrate improved functional strength of L hip abd as demo by a (-) Trendelenberg on the L during gait.    Time 6   Period Weeks   Status New                Plan - 05/04/17 1606    Clinical Impression Statement Pt is pleasant 46 YO F who presents to OPPT with c/o LBP without LE radicular symptoms which has been going on for 4-5 months. Pt currently presents with mild deficits in hip ext MMT, functional weakness of L hip abd as demo by +Trendelenberg during gait, increased soft tissue restrictions of lumbar paraspinals, and mild hypomobility of lumbar spine. She also has deficits in L hip IR as compared to the R, deficits in gait, balance and in sitting posture. Pt would benefit from skilled PT intervention to address these deficits in order to maximize overall function. PT feels pt is appropriate for 2x/week for 4 weeks, however, due to pt's work schedule, she stated she may not be able to make it to 2 sessions per week every week. Therefore, PT is making cert for 6 weeks to ensure that pt is able to attend enough sessions in order to fully benefit from skilled PT services.   History and Personal Factors relevant to plan of care: young, motivated, healthy, first time dealing with this issue   Clinical Presentation Stable   Clinical Presentation due to: MMT, L  SLS, trendelenberg on L, soft tissue restrictions, CPAs   Clinical Decision Making Low   Rehab Potential Good   PT Frequency 2x / week   PT Duration 6 weeks   PT Treatment/Interventions ADLs/Self Care Home Management;Cryotherapy;Electrical Stimulation;Moist Heat;Traction;Gait training;Stair training;Functional mobility training;Therapeutic activities;Therapeutic exercise;Balance training;Neuromuscular re-education;Patient/family education;Manual techniques;Passive range of motion;Dry needling;Energy conservation;Taping   PT Next Visit Plan review goals and HEP, quad stretching, add in lateral lean to child's pose, hip abd strengthening, hip stabilization work on foam, core strengthening/stabilization, manual to lumbar paraspinals, CPAs if with PT, postural strengthening and education on sitting with lumbar roll   PT Home Exercise Plan eval: SKTC, DKTC, child's pose, standing hip abd with GTB   Consulted and Agree with Plan of Care Patient      Patient will benefit from skilled therapeutic intervention in order to improve the following deficits and impairments:  Abnormal gait, Decreased balance, Decreased range of motion, Decreased strength, Difficulty walking, Hypomobility, Increased muscle spasms, Postural dysfunction, Pain  Visit Diagnosis: Chronic bilateral low back pain without sciatica - Plan: PT plan of care cert/re-cert  Muscle weakness (generalized) - Plan: PT  plan of care cert/re-cert  Abnormal posture - Plan: PT plan of care cert/re-cert  Other symptoms and signs involving the musculoskeletal system - Plan: PT plan of care cert/re-cert     Problem List Patient Active Problem List   Diagnosis Date Noted  . Vitamin D deficiency 09/07/2016  . Heart murmur 09/06/2016  . Hyperlipidemia 09/06/2016  . Hyperhidrosis of axilla 09/06/2016      Geraldine Solar PT, DPT  [facility] [address] [place], [place], [address] Phone:  [phone]   Fax:  [phone]  Name: Kelly Buchanan MRN: [id number] Date of Birth: Feb 16, 1971

## 2017-05-15 ENCOUNTER — Ambulatory Visit (HOSPITAL_COMMUNITY): Payer: PRIVATE HEALTH INSURANCE | Admitting: Physical Therapy

## 2017-05-15 DIAGNOSIS — M545 Low back pain: Secondary | ICD-10-CM | POA: Diagnosis not present

## 2017-05-15 DIAGNOSIS — R29898 Other symptoms and signs involving the musculoskeletal system: Secondary | ICD-10-CM

## 2017-05-15 DIAGNOSIS — R293 Abnormal posture: Secondary | ICD-10-CM

## 2017-05-15 DIAGNOSIS — G8929 Other chronic pain: Secondary | ICD-10-CM

## 2017-05-15 DIAGNOSIS — M6281 Muscle weakness (generalized): Secondary | ICD-10-CM

## 2017-05-15 NOTE — Therapy (Signed)
[place], [place], [address] Phone: [phone]   Fax:  [phone]  Physical Therapy Treatment  Patient Details  Name: Kelly Buchanan MRN: [id number] Date of Birth: 1971-04-20 Referring Provider: Raylene Everts, MD  Encounter Date: 05/15/2017      PT End of Session - 05/15/17 1728    Visit Number 2   Number of Visits 13   Date for PT Re-Evaluation 06/15/17   Authorization Type [employer]   Authorization Time Period 05/04/17 to 06/01/17   PT Start Time 1640   PT Stop Time 1728   PT Time Calculation (min) 48 min   Activity Tolerance Patient tolerated treatment well;No increased pain   Behavior During Therapy WFL for tasks assessed/performed      Past Medical History:  Diagnosis Date  . Hyperlipidemia   . Murmur     Past Surgical History:  Procedure Laterality Date  . APPENDECTOMY  1980's  . TONSILLECTOMY  1999  . TUBAL LIGATION  1997    There were no vitals filed for this visit.      Subjective Assessment - 05/15/17 1741    Subjective currently without pain. States she's been taking her pills at night if she needs them and has been waking up without pain.  Pt reports complaince with HEP.  States she forgot to bring her gun belt this session.    Currently in Pain? No/denies                         [facility] Adult PT Treatment/Exercise - 05/15/17 0001      Knee/Hip Exercises: Stretches   Piriformis Stretch Both;2 reps;30 seconds   Piriformis Stretch Limitations seated   Other Knee/Hip Stretches reveiwed HEP:  SKTC, DKTC, childs pose, standing hip ABD with green TB   Other Knee/Hip Stretches childs pose with lateral movements 5 reps 10 seconds     Knee/Hip Exercises: Supine   Bridges 10 reps   Other Supine Knee/Hip Exercises clams 10 reps each     Knee/Hip Exercises: Sidelying   Hip ABduction Both;10 reps     Manual Therapy   Manual Therapy Soft tissue mobilization   Manual  therapy comments completed seperately from all other skilled intervention   Soft tissue mobilization prone to bilateral lumbar paraspinals                PT Education - 05/15/17 1742    Education provided Yes   Education Details goals and HEP per evaluation   Person(s) Educated Patient   Methods Explanation;Demonstration;Tactile cues;Verbal cues;Handout   Comprehension Verbalized understanding;Returned demonstration;Verbal cues required;Tactile cues required;Need further instruction          PT Short Term Goals - 05/04/17 1621      PT SHORT TERM GOAL #1   Title Pt will be independent with HEP and perform consistently in order to maximize return to PLOF and decrease risk of reinjury.   Time 3   Period Weeks   Status New   Target Date 05/25/17     PT SHORT TERM GOAL #2   Title Pt will have improved lumbar ext AROM to WNL and without pain to maximize overall function.   Time 3   Period Weeks   Status New     PT SHORT TERM GOAL #3   Title Pt will have improved L hip IR to WNL to decrease pain and improve overall function.   Time 3  Period Weeks   Status New           PT Long Term Goals - 05/04/17 1625      PT LONG TERM GOAL #1   Title Pt will have improved tolerance to sitting to at least 2 hours with minimal to no pain in order to maximize work function.   Time 6   Period Weeks   Status New   Target Date 06/15/17     PT LONG TERM GOAL #2   Title Pt will be able to maintain L SLS for 15 sec or > to maximize gait.   Time 6   Period Weeks   Status New     PT LONG TERM GOAL #3   Title Pt will demonstrate improved functional strength of L hip abd as demo by a (-) Trendelenberg on the L during gait.    Time 6   Period Weeks   Status New               Plan - 05/15/17 1732    Clinical Impression Statement reviewed goals and HEP with patient who was able to demonstrate correctly.  Added additional therex as insttructed by evaluating PT.  Verbal  cues required with stab exercises to complete slow and controlled.   Pt reported good relief and stretch obtained with child pose and lateral directions of child pose.  PRogressed core stability this session adding clams and bridge.  Also added sidelying hip abduction to increase strength.  Ended session with manual to lumbar spine in prone.  some tightness noted in paraspinals but no spasms indicated. No return of symtoms at end of session.     Rehab Potential Good   PT Frequency 2x / week   PT Duration 6 weeks   PT Treatment/Interventions ADLs/Self Care Home Management;Cryotherapy;Electrical Stimulation;Moist Heat;Traction;Gait training;Stair training;Functional mobility training;Therapeutic activities;Therapeutic exercise;Balance training;Neuromuscular re-education;Patient/family education;Manual techniques;Passive range of motion;Dry needling;Energy conservation;Taping   PT Next Visit Plan Progress hip stabilization work on foam, core strengthening/stabilization.  continue manual to lumbar paraspinals as needed.  CPAs if with PT, postural strengthening and education on sitting with lumbar roll   PT Home Exercise Plan eval: SKTC, DKTC, child's pose, standing hip abd with GTB   Consulted and Agree with Plan of Care Patient      Patient will benefit from skilled therapeutic intervention in order to improve the following deficits and impairments:  Abnormal gait, Decreased balance, Decreased range of motion, Decreased strength, Difficulty walking, Hypomobility, Increased muscle spasms, Postural dysfunction, Pain  Visit Diagnosis: Chronic bilateral low back pain without sciatica  Muscle weakness (generalized)  Abnormal posture  Other symptoms and signs involving the musculoskeletal system     Problem List Patient Active Problem List   Diagnosis Date Noted  . Vitamin D deficiency 09/07/2016  . Heart murmur 09/06/2016  . Hyperlipidemia 09/06/2016  . Hyperhidrosis of axilla 09/06/2016    Teena Irani, PTA/CLT [phone]  Teena Irani 05/15/2017, 5:45 PM  [facility] [address] [place], [place], [address] Phone: [phone]   Fax:  [phone]  Name: Kelly Buchanan MRN: [id number] Date of Birth: June 08, 1971

## 2017-05-17 ENCOUNTER — Ambulatory Visit (HOSPITAL_COMMUNITY): Payer: PRIVATE HEALTH INSURANCE | Admitting: Physical Therapy

## 2017-05-17 DIAGNOSIS — G8929 Other chronic pain: Secondary | ICD-10-CM

## 2017-05-17 DIAGNOSIS — M545 Low back pain, unspecified: Secondary | ICD-10-CM

## 2017-05-17 DIAGNOSIS — R293 Abnormal posture: Secondary | ICD-10-CM

## 2017-05-17 DIAGNOSIS — M6281 Muscle weakness (generalized): Secondary | ICD-10-CM

## 2017-05-17 DIAGNOSIS — R29898 Other symptoms and signs involving the musculoskeletal system: Secondary | ICD-10-CM

## 2017-05-17 NOTE — Therapy (Signed)
[place], [place], [address] Phone: [phone]   Fax:  [phone]  Physical Therapy Treatment  Patient Details  Name: Kelly Buchanan MRN: [id number] Date of Birth: [birth year] Referring Provider: Raylene Everts, MD  Encounter Date: 05/17/2017      PT End of Session - 05/17/17 1048    Visit Number 3   Number of Visits 13   Date for PT Re-Evaluation 06/15/17   Authorization Type [employer]   Authorization Time Period 05/04/17 to 06/01/17   Authorization - Visit Number 3   Authorization - Number of Visits 10   PT Start Time 3664   PT Stop Time 1038   PT Time Calculation (min) 43 min   Activity Tolerance Patient tolerated treatment well;No increased pain   Behavior During Therapy WFL for tasks assessed/performed      Past Medical History:  Diagnosis Date  . Hyperlipidemia   . Murmur     Past Surgical History:  Procedure Laterality Date  . APPENDECTOMY  1980's  . TONSILLECTOMY  1999  . TUBAL LIGATION  1997    There were no vitals filed for this visit.      Subjective Assessment - 05/17/17 1002    Subjective Pt woke up in pain.  Pain is in the center of her back.   Pt bed is low and she is having difficulty coming from sit to stand    Limitations Sitting   How long can you sit comfortably? 45 mins and has to get out of the car and stretch   How long can you stand comfortably? 30 mins   How long can you walk comfortably? unsure, doesn't do much walking   Patient Stated Goals get back straight if she can   Currently in Pain? Yes   Pain Score 4    Pain Location Back   Pain Orientation Mid;Right;Left   Pain Descriptors / Indicators Aching;Tightness   Pain Type Acute pain   Pain Onset In the past 7 days   Pain Frequency Intermittent   Aggravating Factors  sleeping on her mattress                         [facility] Adult PT Treatment/Exercise - 05/17/17 0001      Exercises   Exercises  Lumbar     Lumbar Exercises: Standing   Scapular Retraction Strengthening;10 reps   Theraband Level (Scapular Retraction) Level 3 (Green)   Row [employer];Theraband   Theraband Level (Row) Level 3 (Green)   Shoulder Extension 10 reps;Theraband   Theraband Level (Shoulder Extension) Level 3 (Green)     Lumbar Exercises: Seated   Sit to Stand 10 reps   Sit to Stand Limitations lower surface    Other Seated Lumbar Exercises glut sets; transverse abdominal set x 10      Lumbar Exercises: Quadruped   Madcat/Old Horse 5 reps   Opposite Arm/Leg Raise 5 reps   Other Quadruped Lumbar Exercises child pose      Manual Therapy   Manual Therapy Joint mobilization;Soft tissue mobilization   Manual therapy comments completed seperately from all other skilled intervention   Joint Mobilization Grade I, II mobilizations    Soft tissue mobilization prone to bilateral lumbar paraspinals                  PT Short Term Goals - 05/17/17 1051      PT SHORT TERM GOAL #  1   Title Pt will be independent with HEP and perform consistently in order to maximize return to PLOF and decrease risk of reinjury.   Time 3   Period Weeks   Status On-going     PT SHORT TERM GOAL #2   Title Pt will have improved lumbar ext AROM to WNL and without pain to maximize overall function.   Time 3   Period Weeks   Status On-going     PT SHORT TERM GOAL #3   Title Pt will have improved L hip IR to WNL to decrease pain and improve overall function.   Time 3   Period Weeks   Status On-going           PT Long Term Goals - 05/17/17 1052      PT LONG TERM GOAL #1   Title Pt will have improved tolerance to sitting to at least 2 hours with minimal to no pain in order to maximize work function.   Time 6   Period Weeks   Status On-going     PT LONG TERM GOAL #2   Title Pt will be able to maintain L SLS for 15 sec or > to maximize gait.   Time 6   Period Weeks   Status On-going     PT LONG  TERM GOAL #3   Title Pt will demonstrate improved functional strength of L hip abd as demo by a (-) Trendelenberg on the L during gait.    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  Manual demonstrates tight mm but no spasms palpatable.      Rehab Potential Good   PT Frequency 2x / week   PT Duration 6 weeks   PT Treatment/Interventions ADLs/Self Care Home Management;Cryotherapy;Electrical Stimulation;Moist Heat;Traction;Gait training;Stair training;Functional mobility training;Therapeutic activities;Therapeutic exercise;Balance training;Neuromuscular re-education;Patient/family education;Manual techniques;Passive range of motion;Dry needling;Energy conservation;Taping   PT Next Visit Plan Continue to progress core stabilization.  Give pt tband and postural exercises as well as quadriped exercises for HEP if able to complete with good form.    PT Home Exercise Plan eval: SKTC, DKTC, child's pose, standing hip abd with GTB   Consulted and Agree with Plan of Care Patient      Patient will benefit from skilled therapeutic intervention in order to improve the following deficits and impairments:  Abnormal gait, Decreased balance, Decreased range of motion, Decreased strength, Difficulty walking, Hypomobility, Increased muscle spasms, Postural dysfunction, Pain  Visit Diagnosis: Chronic bilateral low back pain without sciatica  Muscle weakness (generalized)  Abnormal posture  Other symptoms and signs involving the musculoskeletal system     Problem List Patient Active Problem List   Diagnosis Date Noted  . Vitamin D deficiency 09/07/2016  . Heart murmur 09/06/2016  . Hyperlipidemia 09/06/2016  . Hyperhidrosis of  axilla 09/06/2016    Rayetta Humphrey, PT CLT [phone] 05/17/2017, 10:53 AM  [facility] [address] [place], [place], [address] Phone: [phone]   Fax:  [phone]  Name: Kelly Buchanan MRN: [id number] Date of Birth: 01-10-1971

## 2017-05-22 ENCOUNTER — Ambulatory Visit (HOSPITAL_COMMUNITY): Payer: PRIVATE HEALTH INSURANCE | Admitting: Physical Therapy

## 2017-05-22 DIAGNOSIS — M545 Low back pain: Secondary | ICD-10-CM | POA: Diagnosis not present

## 2017-05-22 DIAGNOSIS — R29898 Other symptoms and signs involving the musculoskeletal system: Secondary | ICD-10-CM

## 2017-05-22 DIAGNOSIS — G8929 Other chronic pain: Secondary | ICD-10-CM

## 2017-05-22 DIAGNOSIS — R293 Abnormal posture: Secondary | ICD-10-CM

## 2017-05-22 DIAGNOSIS — M6281 Muscle weakness (generalized): Secondary | ICD-10-CM

## 2017-05-22 NOTE — Therapy (Signed)
[place], [place], [address] Phone: [phone]   Fax:  [phone]  Physical Therapy Treatment  Patient Details  Name: Kelly Buchanan MRN: [id number] Date of Birth: 05/31/1971 Referring Provider: Raylene Everts, MD  Encounter Date: 05/22/2017      PT End of Session - 05/22/17 0904    Visit Number 4   Number of Visits 13   Date for PT Re-Evaluation 06/15/17   Authorization Type [employer]   Authorization Time Period 05/04/17 to 06/01/17   Authorization - Visit Number 4   Authorization - Number of Visits 10   PT Start Time 0817   PT Stop Time 0900   PT Time Calculation (min) 43 min   Activity Tolerance Patient tolerated treatment well;No increased pain   Behavior During Therapy WFL for tasks assessed/performed      Past Medical History:  Diagnosis Date  . Hyperlipidemia   . Murmur     Past Surgical History:  Procedure Laterality Date  . APPENDECTOMY  1980's  . TONSILLECTOMY  1999  . TUBAL LIGATION  1997    There were no vitals filed for this visit.      Subjective Assessment - 05/22/17 0821    Subjective PT states she is currently without pain, however had increased pain yesterday after going to [facility] on Saturday.    Currently in Pain? No/denies                         [facility] Adult PT Treatment/Exercise - 05/22/17 0001      Lumbar Exercises: Standing   Scapular Retraction Strengthening;10 reps   Theraband Level (Scapular Retraction) Level 3 (Green)   Row Strengthening;10 reps;Theraband   Theraband Level (Row) Level 3 (Green)   Shoulder Extension 10 reps;Theraband   Theraband Level (Shoulder Extension) Level 3 (Green)   Other Standing Lumbar Exercises stand extension 5 reps     Lumbar Exercises: Seated   Sit to Stand 10 reps   Sit to Stand Limitations lower surface      Lumbar Exercises: Supine   Bridge 10 reps   Bridge Limitations single leg   Straight Leg  Raise 10 reps   Straight Leg Raises Limitations with stab   Large Ball Abdominal Isometric 10 reps   Large Ball Abdominal Isometric Limitations green   Large Ball Oblique Isometric 10 reps   Large Ball Oblique Isometric Limitations green     Lumbar Exercises: Quadruped   Madcat/Old Horse 10 reps   Opposite Arm/Leg Raise 10 reps   Other Quadruped Lumbar Exercises child pose center and laterals 5X10" each way     Knee/Hip Exercises: Sidelying   Hip ABduction Both;10 reps;2 sets     Manual Therapy   Manual Therapy Soft tissue mobilization   Manual therapy comments completed seperately from all other skilled intervention   Soft tissue mobilization prone to bilateral lumbar paraspinals                PT Education - 05/22/17 0903    Education provided Yes   Education Details importance of getting out of car and stretching frequently (pt is Engineer, 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   Time 3   Period Weeks   Status On-going     PT SHORT TERM GOAL #2   Title Pt will have improved lumbar ext AROM to WNL and without pain to maximize overall function.   Time 3   Period Weeks   Status On-going     PT SHORT TERM GOAL #3   Title Pt will have improved L hip IR to WNL to decrease pain and improve overall function.   Time 3   Period Weeks   Status On-going           PT Long Term Goals - 05/17/17 1052      PT LONG TERM GOAL #1   Title Pt will have improved tolerance to sitting to at least 2 hours with minimal to no pain in order to maximize work function.   Time 6   Period Weeks   Status On-going     PT LONG TERM GOAL #2   Title Pt will be able to maintain L SLS  for 15 sec or > to maximize gait.   Time 6   Period Weeks   Status On-going     PT LONG TERM GOAL #3   Title Pt will demonstrate improved functional strength of L hip abd as demo by a (-) Trendelenberg on the L during gait.    Time 6   Period Weeks   Status On-going               Plan - 05/22/17 5498    Clinical Impression Statement continued with postural strengthening, core stabilization and general education.  Pt with improving glute activation wtih sit to stands, however still requiring cues to remain upright.  Progressed stab with addition of single leg bridge and SLR this session.  Initiated abdominal isometrics using physioball.  manual completed at EOS with minimal tightness/spasm noted.    Rehab Potential Good   PT Frequency 2x / week   PT Duration 6 weeks   PT Treatment/Interventions ADLs/Self Care Home Management;Cryotherapy;Electrical Stimulation;Moist Heat;Traction;Gait training;Stair training;Functional mobility training;Therapeutic activities;Therapeutic exercise;Balance training;Neuromuscular re-education;Patient/family education;Manual techniques;Passive range of motion;Dry needling;Energy conservation;Taping   PT Next Visit Plan Continue to progress core stabilization.  Give pt tband and postural exercises as well as quadriped exercises for HEP if able to complete with good form.    PT Home Exercise Plan eval: SKTC, DKTC, child's pose, standing hip abd with GTB   Consulted and Agree with Plan of Care Patient      Patient will benefit from skilled therapeutic intervention in order to improve the following deficits and impairments:  Abnormal gait, Decreased balance, Decreased range of motion, Decreased strength, Difficulty walking, Hypomobility, Increased muscle spasms, Postural dysfunction, Pain  Visit Diagnosis: Chronic bilateral low back pain without sciatica  Muscle weakness (generalized)  Abnormal posture  Other symptoms and signs involving the  musculoskeletal system     Problem List Patient Active Problem List   Diagnosis Date Noted  . Vitamin D deficiency 09/07/2016  . Heart murmur 09/06/2016  . Hyperlipidemia 09/06/2016  . Hyperhidrosis of axilla 09/06/2016   Kelly Buchanan, PTA/CLT [phone]  Kelly Buchanan 05/22/2017, 9:06 AM  [facility] [address] [place], [place], [address] Phone: [phone]   Fax:  [phone]  Name: Kelly Buchanan MRN: [id number] Date of Birth: [birth year]

## 2017-05-25 ENCOUNTER — Telehealth (HOSPITAL_COMMUNITY): Payer: Self-pay

## 2017-05-25 ENCOUNTER — Ambulatory Visit (HOSPITAL_COMMUNITY): Payer: PRIVATE HEALTH INSURANCE

## 2017-05-25 ENCOUNTER — Encounter (HOSPITAL_COMMUNITY): Payer: Self-pay

## 2017-05-25 DIAGNOSIS — M545 Low back pain, unspecified: Secondary | ICD-10-CM

## 2017-05-25 DIAGNOSIS — M6281 Muscle weakness (generalized): Secondary | ICD-10-CM

## 2017-05-25 DIAGNOSIS — G8929 Other chronic pain: Secondary | ICD-10-CM

## 2017-05-25 DIAGNOSIS — R293 Abnormal posture: Secondary | ICD-10-CM

## 2017-05-25 DIAGNOSIS — R29898 Other symptoms and signs involving the musculoskeletal system: Secondary | ICD-10-CM

## 2017-05-25 NOTE — Telephone Encounter (Signed)
Patient faxed new insurance care to [facility] on 05/04/2017, but new insurance was not put in epic. I entered new insurance information into epic today. NF 05/25/17

## 2017-05-25 NOTE — Therapy (Signed)
[place], [place], [address] Phone: [phone]   Fax:  [phone]  Physical Therapy Treatment  Patient Details  Name: Kelly Buchanan MRN: [id number] Date of Birth: 1971-09-14 Referring Provider: Raylene Everts, MD  Encounter Date: 05/25/2017      PT End of Session - 05/25/17 1115    Visit Number 5   Number of Visits 13   Date for PT Re-Evaluation 06/15/17   Authorization Type [employer]   Authorization Time Period 05/04/17 to 06/01/17   Authorization - Visit Number 5   Authorization - Number of Visits 10   PT Start Time 6962   PT Stop Time 1155   PT Time Calculation (min) 41 min   Activity Tolerance Patient tolerated treatment well;No increased pain   Behavior During Therapy WFL for tasks assessed/performed      Past Medical History:  Diagnosis Date  . Hyperlipidemia   . Murmur     Past Surgical History:  Procedure Laterality Date  . APPENDECTOMY  1980's  . TONSILLECTOMY  1999  . TUBAL LIGATION  1997    There were no vitals filed for this visit.      Subjective Assessment - 05/25/17 1115    Subjective Pt states that she is stiff this morning. She states that when she works she feels like she gets back to square one. No pain currently, just stiffness.   Currently in Pain? No/denies                [facility] Adult PT Treatment/Exercise - 05/25/17 0001      Lumbar Exercises: Standing   Scapular Retraction Strengthening;Both;15 reps   Theraband Level (Scapular Retraction) Level 4 (Blue)   Row Strengthening;Both;15 reps   Theraband Level (Row) Level 4 (Blue)   Other Standing Lumbar Exercises bil SLS on foam and palov press x15 reps; bil march on foam and pulldowns with 3" holds and purple band x15 reps   Other Standing Lumbar Exercises bil band pulls with GTB and scap retraction plus ER with GTB x15 reps each; sidestepping 27ftx2RT with GTB; fwd/retro monster walks with GTB x1RT     Lumbar  Exercises: Seated   Other Seated Lumbar Exercises 3D thoracic excursions x5 reps each     Lumbar Exercises: Supine   Bridge 5 reps   Bridge Limitations single leg with band, 2 sets   Other Supine Lumbar Exercises bent knee raise from 4" step with pulldowns with purlple band 2x10 reps each; uni dead bugs with phyioball 2x10 reps each     Lumbar Exercises: Sidelying   Other Sidelying Lumbar Exercises side planks with hip abd 2x5 reps each     Lumbar Exercises: Prone   Other Prone Lumbar Exercises rollouts on phyioball from knees 2x5 reps     Lumbar Exercises: Quadruped   Opposite Arm/Leg Raise Limitations birddogs x15 reps touching over center                 PT Education - 05/25/17 1202    Education provided Yes   Education Details continue HEP, added GTB band pulls, scap retraction with ER with GTB, and 3D thoracic excursions to HEP,    Person(s) Educated Patient   Methods Explanation;Demonstration   Comprehension Verbalized understanding;Returned demonstration          PT Short Term Goals - 05/17/17 1051      PT SHORT TERM GOAL #1   Title Pt will be independent with HEP and  perform consistently in order to maximize return to PLOF and decrease risk of reinjury.   Time 3   Period Weeks   Status On-going     PT SHORT TERM GOAL #2   Title Pt will have improved lumbar ext AROM to WNL and without pain to maximize overall function.   Time 3   Period Weeks   Status On-going     PT SHORT TERM GOAL #3   Title Pt will have improved L hip IR to WNL to decrease pain and improve overall function.   Time 3   Period Weeks   Status On-going           PT Long Term Goals - 05/17/17 1052      PT LONG TERM GOAL #1   Title Pt will have improved tolerance to sitting to at least 2 hours with minimal to no pain in order to maximize work function.   Time 6   Period Weeks   Status On-going     PT LONG TERM GOAL #2   Title Pt will be able to maintain L SLS for 15 sec or >  to maximize gait.   Time 6   Period Weeks   Status On-going     PT LONG TERM GOAL #3   Title Pt will demonstrate improved functional strength of L hip abd as demo by a (-) Trendelenberg on the L during gait.    Time 6   Period Weeks   Status On-going               Plan - 05/25/17 1203    Clinical Impression Statement Session focused on core stabilization, hip strengthening and postural strengthening this date. Pt tolerated progressed therex well with no reports of back pain. She required max cues for proper form with rollouts on the physioball. 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 Also encouraged pt to use lumbar roll while sitting as she keeps forgetting to use one. She reported no pain and no stiffness at EOS. Continue POC as planned.   Rehab Potential Good   PT Frequency 2x / week   PT Duration 6 weeks   PT Treatment/Interventions ADLs/Self Care Home Management;Cryotherapy;Electrical Stimulation;Moist Heat;Traction;Gait training;Stair training;Functional mobility training;Therapeutic activities;Therapeutic exercise;Balance training;Neuromuscular re-education;Patient/family education;Manual techniques;Passive range of motion;Dry needling;Energy conservation;Taping   PT Next Visit Plan Continue to progress core stabilization, hip strength, and postural strength. review 3D thoracic excursions; add dynadisc to birddogs, add hip hikes, add knee drives with ER on 8" step resisting medial pull   PT Home Exercise Plan eval: SKTC, DKTC, child's pose, standing hip abd with GTB; 8/23: scap retraction with ER and band pulls with GTB, 3D thoracic excursions   Consulted and Agree with Plan of Care Patient      Patient will benefit from skilled therapeutic intervention in order to improve the following deficits and impairments:  Abnormal gait, Decreased balance, Decreased range of motion, Decreased  strength, Difficulty walking, Hypomobility, Increased muscle spasms, Postural dysfunction, Pain  Visit Diagnosis: Chronic bilateral low back pain without sciatica  Muscle weakness (generalized)  Abnormal posture  Other symptoms and signs involving the musculoskeletal system     Problem List Patient Active Problem List   Diagnosis Date Noted  . Vitamin D deficiency 09/07/2016  . Heart murmur 09/06/2016  . Hyperlipidemia 09/06/2016  . Hyperhidrosis of axilla 09/06/2016      Geraldine Solar  PT, DPT  [facility] [address] [place], [place], [address] Phone: [phone]   Fax:  [phone]  Name: Kelly Buchanan MRN: [id number] Date of Birth: 1970/10/16

## 2017-06-01 ENCOUNTER — Ambulatory Visit (HOSPITAL_COMMUNITY): Payer: PRIVATE HEALTH INSURANCE | Admitting: Physical Therapy

## 2017-06-01 ENCOUNTER — Telehealth (HOSPITAL_COMMUNITY): Payer: Self-pay | Admitting: Family Medicine

## 2017-06-01 NOTE — Telephone Encounter (Signed)
06/01/17  pt cx but no reason was given

## 2017-06-06 ENCOUNTER — Ambulatory Visit (HOSPITAL_COMMUNITY): Payer: PRIVATE HEALTH INSURANCE | Admitting: Physical Therapy

## 2017-06-07 ENCOUNTER — Ambulatory Visit (HOSPITAL_COMMUNITY): Payer: PRIVATE HEALTH INSURANCE

## 2017-06-07 ENCOUNTER — Telehealth (HOSPITAL_COMMUNITY): Payer: Self-pay | Admitting: Family Medicine

## 2017-06-07 NOTE — Telephone Encounter (Signed)
06/07/17  she said to cx because she was tied up at the [employer]

## 2017-06-12 ENCOUNTER — Ambulatory Visit (HOSPITAL_COMMUNITY): Payer: PRIVATE HEALTH INSURANCE | Attending: Family Medicine | Admitting: Physical Therapy

## 2017-06-12 DIAGNOSIS — R29898 Other symptoms and signs involving the musculoskeletal system: Secondary | ICD-10-CM | POA: Diagnosis present

## 2017-06-12 DIAGNOSIS — M545 Low back pain: Secondary | ICD-10-CM | POA: Diagnosis present

## 2017-06-12 DIAGNOSIS — M6281 Muscle weakness (generalized): Secondary | ICD-10-CM | POA: Insufficient documentation

## 2017-06-12 DIAGNOSIS — G8929 Other chronic pain: Secondary | ICD-10-CM | POA: Diagnosis present

## 2017-06-12 DIAGNOSIS — R293 Abnormal posture: Secondary | ICD-10-CM | POA: Diagnosis present

## 2017-06-12 NOTE — Therapy (Signed)
[place], [place], [address] Phone: [phone]   Fax:  [phone]  Physical Therapy Treatment  Patient Details  Name: Kelly Buchanan MRN: [id number] Date of Birth: 12-25-1970 Referring Provider: Raylene Everts, MD  Encounter Date: 06/12/2017      PT End of Session - 06/12/17 1733    Visit Number 6   Number of Visits 13   Date for PT Re-Evaluation 06/15/17   Authorization Type [employer]   Authorization Time Period 05/04/17 to 06/15/17   Authorization - Visit Number 6   Authorization - Number of Visits 10   PT Start Time 1761   PT Stop Time 1730   PT Time Calculation (min) 35 min   Activity Tolerance Patient tolerated treatment well;No increased pain   Behavior During Therapy WFL for tasks assessed/performed      Past Medical History:  Diagnosis Date  . Hyperlipidemia   . Murmur     Past Surgical History:  Procedure Laterality Date  . APPENDECTOMY  1980's  . TONSILLECTOMY  1999  . TUBAL LIGATION  1997    There were no vitals filed for this visit.      Subjective Assessment - 06/12/17 1659    Subjective Pt states she has 5/10 pain currently but had 10/10 pain yesterday in her Rt hip/lumbar region. 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  States she is mostly standing/walking most of the day.  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  STates her pain started increasing on Thursday.  Admits to not doing her HEP over the weekend as she hurt too bad.    Currently in Pain? Yes   Pain Score 5    Pain Location Back   Pain Orientation Right;Lower;Mid   Pain Descriptors / Indicators Aching;Patsi Sears Adult PT Treatment/Exercise - 06/12/17 0001      Lumbar Exercises: Quadruped   Madcat/Old Horse 10 reps   Plank prone on elbows   Other Quadruped Lumbar Exercises child pose center and  laterals 5X10" each way     Knee/Hip Exercises: Stretches   Active Hamstring Stretch Both;2 reps;30 seconds     Knee/Hip Exercises: Supine   Bridges 10 reps     Manual Therapy   Manual Therapy Soft tissue mobilization   Manual therapy comments completed seperately from all other skilled intervention   Soft tissue mobilization prone to bilateral lumbar paraspinals                PT Education - 06/12/17 1739    Education provided Yes   Education Details Discussed patients belt, problem solving arrangement of needed tools. instructed to continue her stretches.     Person(s) Educated Patient   Methods Explanation   Comprehension Verbalized understanding          PT Short Term Goals - 05/17/17 1051      PT SHORT TERM GOAL #1   Title Pt will be independent with HEP and perform consistently in order to maximize return to PLOF and decrease risk of reinjury.   Time 3   Period Weeks   Status On-going     PT SHORT TERM GOAL #2   Title Pt will have improved lumbar ext AROM to WNL and without pain to  maximize overall function.   Time 3   Period Weeks   Status On-going     PT SHORT TERM GOAL #3   Title Pt will have improved L hip IR to WNL to decrease pain and improve overall function.   Time 3   Period Weeks   Status On-going           PT Long Term Goals - 05/17/17 1052      PT LONG TERM GOAL #1   Title Pt will have improved tolerance to sitting to at least 2 hours with minimal to no pain in order to maximize work function.   Time 6   Period Weeks   Status On-going     PT LONG TERM GOAL #2   Title Pt will be able to maintain L SLS for 15 sec or > to maximize gait.   Time 6   Period Weeks   Status On-going     PT LONG TERM GOAL #3   Title Pt will demonstrate improved functional strength of L hip abd as demo by a (-) Trendelenberg on the L during gait.    Time 6   Period Weeks   Status On-going               Plan - 06/12/17 1734    Clinical  Impression Statement Pt with increased pain since last session that may be due to job change.  States pain was worse than it was initially and had to physically get help to sit down and get up from chair.  Pt has been using heat and resting at home but not doing her HEP.  Instrcted to add stretches and gentle strengthenign back into POC.  Began session with manual to Lumbar and glute musculature, Rt>Lt.  No specific spasms found, however did have general tightness and sensitivity around Rt PSIS.  Checked for pelvic rotation, however patient was in good alignment.  Did not complete higher level therex as added last session due to increased pain.  Added prone on elbow positioning with good results.   Rehab Potential Good   PT Frequency 2x / week   PT Duration 6 weeks   PT Treatment/Interventions ADLs/Self Care Home Management;Cryotherapy;Electrical Stimulation;Moist Heat;Traction;Gait training;Stair training;Functional mobility training;Therapeutic activities;Therapeutic exercise;Balance training;Neuromuscular re-education;Patient/family education;Manual techniques;Passive range of motion;Dry needling;Energy conservation;Taping   PT Next Visit Plan Continue with manual as needed.  Resume higher level therex and Continue to progress core stabilization, hip strength, and postural strength as able.     PT Home Exercise Plan eval: SKTC, DKTC, child's pose, standing hip abd with GTB; 8/23: scap retraction with ER and band pulls with GTB, 3D thoracic excursions   Consulted and Agree with Plan of Care Patient      Patient will benefit from skilled therapeutic intervention in order to improve the following deficits and impairments:  Abnormal gait, Decreased balance, Decreased range of motion, Decreased strength, Difficulty walking, Hypomobility, Increased muscle spasms, Postural dysfunction, Pain  Visit Diagnosis: Chronic bilateral low back pain without sciatica  Muscle weakness (generalized)  Abnormal  posture  Other symptoms and signs involving the musculoskeletal system     Problem List Patient Active Problem List   Diagnosis Date Noted  . Vitamin D deficiency 09/07/2016  . Heart murmur 09/06/2016  . Hyperlipidemia 09/06/2016  . Hyperhidrosis of axilla 09/06/2016   Teena Irani, PTA/CLT [phone]  Teena Irani 06/12/2017, 5:42 PM  [place], [place],  [place] Phone: [phone]   Fax:  [phone]  Name: Kelly Buchanan MRN: [id number] Date of Birth: [birth year]

## 2017-06-14 ENCOUNTER — Telehealth (HOSPITAL_COMMUNITY): Payer: Self-pay | Admitting: Physical Therapy

## 2017-06-14 ENCOUNTER — Ambulatory Visit (HOSPITAL_COMMUNITY): Payer: PRIVATE HEALTH INSURANCE | Admitting: Physical Therapy

## 2017-06-14 NOTE — Telephone Encounter (Signed)
Patient will have to work late today

## 2017-06-19 ENCOUNTER — Ambulatory Visit (HOSPITAL_COMMUNITY): Payer: PRIVATE HEALTH INSURANCE | Admitting: Physical Therapy

## 2017-06-20 ENCOUNTER — Telehealth (HOSPITAL_COMMUNITY): Payer: Self-pay | Admitting: Physical Therapy

## 2017-06-20 NOTE — Telephone Encounter (Signed)
Patient called to cancel all remaining appointments, she cannot afford to come.   Deniece Ree PT, DPT [phone]

## 2017-06-21 ENCOUNTER — Encounter (HOSPITAL_COMMUNITY): Payer: PRIVATE HEALTH INSURANCE | Admitting: Physical Therapy

## 2017-06-23 ENCOUNTER — Encounter (HOSPITAL_COMMUNITY): Payer: PRIVATE HEALTH INSURANCE

## 2017-08-10 ENCOUNTER — Ambulatory Visit (INDEPENDENT_AMBULATORY_CARE_PROVIDER_SITE_OTHER): Payer: PRIVATE HEALTH INSURANCE | Admitting: Family Medicine

## 2017-08-10 ENCOUNTER — Encounter (HOSPITAL_COMMUNITY): Payer: Self-pay

## 2017-08-10 ENCOUNTER — Other Ambulatory Visit: Payer: Self-pay

## 2017-08-10 ENCOUNTER — Encounter: Payer: Self-pay | Admitting: Family Medicine

## 2017-08-10 VITALS — BP 130/80 | HR 80 | Temp 97.9°F | Resp 16 | Ht 67.0 in | Wt 201.0 lb

## 2017-08-10 DIAGNOSIS — I499 Cardiac arrhythmia, unspecified: Secondary | ICD-10-CM | POA: Diagnosis not present

## 2017-08-10 DIAGNOSIS — Z23 Encounter for immunization: Secondary | ICD-10-CM

## 2017-08-10 NOTE — Therapy (Signed)
[facility] [address] [place], [place], [address] Phone: [phone]   Fax:  [phone]  Patient Details  Name: Kelly Buchanan MRN: [id number] Date of Birth: Jan 19, 1971 Referring Provider:  No ref. provider found  Encounter Date: 08/10/2017  PHYSICAL THERAPY DISCHARGE SUMMARY  Visits from Start of Care: 6  Current functional level related to goals / functional outcomes: See last treatment note   Remaining deficits: See last treatment note   Education / Equipment: NA  Plan: Patient agrees to discharge.  Patient goals were not met. Patient is being discharged due to not returning since the last visit.  ?????      Geraldine Solar PT, [place] [address] [place], [place], [address] Phone: [phone]   Fax:  [phone]

## 2017-08-10 NOTE — Progress Notes (Signed)
Chief Complaint  Patient presents with  . Shortness of Breath    since tuesday   Patient is here for an acute visit.  She called in earlier today requesting to be seen.  She states that she had a verbal altercation with 1 of her daughters a couple of days ago.  Ever since then she has been having spells of rapid heartbeats and shortness of breath.  She is also had more than her usual headaches.  No cough or cold symptoms no fever.  No recent illness.  No chest pain.  She feels like this is stress related but would like to be checked.  The rapid heartbeat spells last only a few minutes.  No dizziness or headache associated with the spells.  Overall just does not feel well.  Is sad and upset about the conversation.  Patient Active Problem List   Diagnosis Date Noted  . Vitamin D deficiency 09/07/2016  . Heart murmur 09/06/2016  . Hyperlipidemia 09/06/2016  . Hyperhidrosis of axilla 09/06/2016    Outpatient Encounter Medications as of 08/10/2017  Medication Sig  . cholecalciferol (VITAMIN D) 1000 units tablet Take 2,000 Units by mouth daily.  . cyclobenzaprine (FLEXERIL) 5 MG tablet Take 1 tablet (5 mg total) by mouth at bedtime. As needed muscle spasm  . naproxen (NAPROSYN) 500 MG tablet Take 1 tablet (500 mg total) by mouth 2 (two) times daily with a meal.   No facility-administered encounter medications on file as of 08/10/2017.     No Known Allergies  Review of Systems  Constitutional: Negative for activity change, appetite change and unexpected weight change.  HENT: Negative for congestion, dental problem, postnasal drip and rhinorrhea.   Eyes: Negative for redness and visual disturbance.  Respiratory: Positive for shortness of breath. Negative for cough.   Cardiovascular: Positive for palpitations. Negative for chest pain and leg swelling.  Gastrointestinal: Negative for abdominal pain, constipation and diarrhea.  Genitourinary: Negative for difficulty urinating, frequency and  menstrual problem.  Musculoskeletal: Negative for arthralgias and back pain.  Neurological: Positive for headaches. Negative for dizziness.  Psychiatric/Behavioral: Negative for dysphoric mood and sleep disturbance. The patient is not nervous/anxious.     BP 130/80 (BP Location: Right Arm, Patient Position: Supine, Cuff Size: Normal)   Pulse 80   Temp 97.9 F (36.6 C) (Temporal)   Resp 16   Ht 5\' 7"  (1.702 m)   Wt 201 lb 0.6 oz (91.2 kg)   SpO2 99%   BMI 31.49 kg/m   Physical Exam  Constitutional: She is oriented to person, place, and time. She appears well-developed and well-nourished.  HENT:  Head: Normocephalic and atraumatic.  Right Ear: External ear normal.  Left Ear: External ear normal.  Mouth/Throat: Oropharynx is clear and moist.  Eyes: Conjunctivae are normal. Pupils are equal, round, and reactive to light.  Neck: Normal range of motion. Neck supple. No thyromegaly present.  Cardiovascular: Normal rate, regular rhythm and normal heart sounds.  Pulmonary/Chest: Effort normal and breath sounds normal. No respiratory distress.  Abdominal: Soft. Bowel sounds are normal.  Musculoskeletal: Normal range of motion. She exhibits no edema.  Lymphadenopathy:    She has no cervical adenopathy.  Neurological: She is alert and oriented to person, place, and time.  Gait normal  Skin: Skin is warm and dry.  Psychiatric: She has a normal mood and affect. Her behavior is normal. Thought content normal.  Nursing note and vitals reviewed.   ASSESSMENT/PLAN:  1. Need for influenza vaccination  done - Flu Vaccine QUAD 36+ mos IM  2. Irregular heart beat Likely stress related - EKG 12-Lead - Holter monitor - 24 hour; Future   Patient Instructions  Avoid caffeine Try to get enough rest I have ordered a 24 hour heart monitor      See me after test     Raylene Everts, MD

## 2017-08-10 NOTE — Patient Instructions (Addendum)
Avoid caffeine Try to get enough rest I have ordered a 24 hour heart monitor      See me after test

## 2017-08-14 ENCOUNTER — Ambulatory Visit (HOSPITAL_COMMUNITY)
Admission: RE | Admit: 2017-08-14 | Discharge: 2017-08-14 | Disposition: A | Discharge status: Home / Self Care | Payer: PRIVATE HEALTH INSURANCE | Source: Ambulatory Visit | Attending: Family Medicine | Admitting: Family Medicine

## 2017-08-14 DIAGNOSIS — I499 Cardiac arrhythmia, unspecified: Secondary | ICD-10-CM | POA: Diagnosis not present

## 2017-08-17 NOTE — Progress Notes (Signed)
Called Kelly Buchanan, aware of test results,

## 2018-03-09 ENCOUNTER — Encounter: Payer: Self-pay | Admitting: Family Medicine

## 2018-06-06 ENCOUNTER — Other Ambulatory Visit: Payer: Self-pay

## 2018-06-06 ENCOUNTER — Encounter: Payer: Self-pay | Admitting: *Deleted

## 2018-06-06 ENCOUNTER — Emergency Department
Admission: EM | Admit: 2018-06-06 | Discharge: 2018-06-06 | Disposition: A | Discharge status: Home / Self Care | Payer: BLUE CROSS/BLUE SHIELD | Attending: Emergency Medicine | Admitting: Emergency Medicine

## 2018-06-06 DIAGNOSIS — M545 Low back pain, unspecified: Secondary | ICD-10-CM

## 2018-06-06 MED ORDER — HYDROCODONE-ACETAMINOPHEN 5-325 MG PO TABS
1.0000 | ORAL_TABLET | Freq: Once | ORAL | Status: AC
  Administered 2018-06-06: 1 via ORAL
  Filled 2018-06-06: qty 1

## 2018-06-06 MED ORDER — PREDNISONE 20 MG PO TABS: 40.0000 mg | ORAL_TABLET | Freq: Every day | ORAL | 0 refills | Status: DC

## 2018-06-06 MED ORDER — HYDROCODONE-ACETAMINOPHEN 5-325 MG PO TABS: 1.0000 | ORAL_TABLET | ORAL | 0 refills | Status: DC | PRN

## 2018-06-06 NOTE — ED Provider Notes (Signed)
[facility] Emergency Department Provider Note  Time seen: 10:12 PM  I have reviewed the triage vital signs and the nursing notes.   HISTORY  Chief Complaint Back Pain    HPI Kelly Buchanan is a 47 y.o. female with a past medical history of hyperlipidemia, lower back pain, presents to the emergency department for lower back pain.  According to the patient years ago she suffered lower back pain and sciatica requiring physical therapy.  States she has been doing well since however tonight she bent over to pick something up and felt a pop in her back ever since she has been having moderate dull pain across her lower back denies any radiation into either leg denies any weakness or numbness denies any incontinence.  Describes the pain is moderate dull pain 6/10 worse with movement especially bending or twisting.   Past Medical History:  Diagnosis Date  . Hyperlipidemia   . Murmur     Patient Active Problem List   Diagnosis Date Noted  . Vitamin D deficiency 09/07/2016  . Heart murmur 09/06/2016  . Hyperlipidemia 09/06/2016  . Hyperhidrosis of axilla 09/06/2016    Past Surgical History:  Procedure Laterality Date  . APPENDECTOMY  1980's  . TONSILLECTOMY  1999  . TUBAL LIGATION  1997    Prior to Admission medications   Medication Sig Start Date End Date Taking? Authorizing Provider  cholecalciferol (VITAMIN D) 1000 units tablet Take 2,000 Units by mouth daily.    [provider]  cyclobenzaprine (FLEXERIL) 5 MG tablet Take 1 tablet (5 mg total) by mouth at bedtime. As needed muscle spasm 04/25/17   Raylene Everts, MD  naproxen (NAPROSYN) 500 MG tablet Take 1 tablet (500 mg total) by mouth 2 (two) times daily with a meal. 04/25/17   Raylene Everts, MD    No Known Allergies  Family History  Problem Relation Age of Onset  . Hypertension Mother   . Heart disease Father 13       died at 36  . Heart attack Father   . Hypertension Father    . Alcohol abuse Father   . Cancer Sister        hodgkins  . Cancer Maternal Aunt   . Drug abuse Brother     Social History Social History   Tobacco Use  . Smoking status: Never Smoker  . Smokeless tobacco: Never Used  Substance Use Topics  . Alcohol use: No    Alcohol/week: 0.0 standard drinks  . Drug use: No    Review of Systems Constitutional: Negative for fever. Cardiovascular: Negative for chest pain. Respiratory: Negative for shortness of breath. Gastrointestinal: Negative for abdominal pain Genitourinary: Negative for incontinence. Musculoskeletal: Moderate lower back pain  All other ROS negative  ____________________________________________   PHYSICAL EXAM:  VITAL SIGNS: ED Triage Vitals [06/06/18 2125]  Enc Vitals Group     BP 139/83     Pulse Rate 81     Resp 16     Temp 98.2 F (36.8 C)     Temp src      SpO2 100 %     Weight      Height      Head Circumference      Peak Flow      Pain Score 6     Pain Loc      Pain Edu?      Excl. in [place]?     Constitutional: Alert and oriented. Well appearing  and in no distress. Eyes: Normal exam ENT   Head: Normocephalic and atraumatic.   Mouth/Throat: Mucous membranes are moist. Cardiovascular: Normal rate, regular rhythm. No murmur Respiratory: Normal respiratory effort without tachypnea nor retractions. Breath sounds are clear Gastrointestinal: Soft and nontender. No distention.   Musculoskeletal: Mild to moderate lower back tenderness to palpation across the lumbar spine.  Mild pain in the midline moderate pain paraspinal.  No C or T-spine tenderness. Neurologic:  Normal speech and language. No gross focal neurologic deficits.  Sensation intact and equal bilateral lower extremities. Skin:  Skin is warm, dry and intact.  Psychiatric: Mood and affect are normal.   ____________________________________________   INITIAL IMPRESSION / ASSESSMENT AND PLAN / ED COURSE  Pertinent labs & imaging  results that were available during my care of the patient were reviewed by me and considered in my medical decision making (see chart for details).  Patient presents emergency department for lower back pain after bending over and feeling a pop.  Mild tenderness over this area rates her pain is a 6/10 worse with bending or twisting.  Very suspicious for lumbar strain or disc injury.  Do not suspect fracture given minimal mechanism.  Patient has a history of back pain in the past.  We will discharge a short course of steroids to reduce inflammation as well as a short course of pain medication.  Patient agreeable to this plan of care.  I did discuss return precautions for any worsening pain numbness, tingling or incontinence.  ____________________________________________   FINAL CLINICAL IMPRESSION(S) / ED DIAGNOSES  Back pain    Harvest Dark, MD 06/06/18 2215

## 2018-06-06 NOTE — ED Notes (Signed)
Pt to the er for pain in the lower back. Pt states she bent over tonight and felt a pop in her back. Pt reports constant pain with no difference between moving or sitting. Pt reports pain moving into her tailbone. Pt has a hx of sciatica in which she did attend physical therapy a year before. Pt attempted to go to work but states her duty belt applying the added weight on her made the pain worse.

## 2018-06-06 NOTE — ED Triage Notes (Signed)
Pt ambulatory to triage with steady gait,  reports around 1700 she bent over while getting dressed and felt a "pop" in her lower back and then felt pain in the area. Denies numbness or tingling. Took 3 aleve for pain at 1945

## 2019-01-10 ENCOUNTER — Telehealth: Payer: Self-pay | Admitting: *Deleted

## 2019-01-10 NOTE — Telephone Encounter (Signed)
Pt voiced consent for telehealth appt with Dr Harl Bowie. medications/allergies/pharmacy reviewed and updated. Pt doesn't have BP cuff at home but will have HR/weight. Verified email

## 2019-01-11 ENCOUNTER — Encounter: Payer: Self-pay | Admitting: Cardiology

## 2019-01-11 ENCOUNTER — Telehealth: Payer: BLUE CROSS/BLUE SHIELD | Admitting: Cardiology

## 2019-01-11 NOTE — Progress Notes (Unsigned)
{Choose 1 Note Type (Telehealth Visit or Telephone Visit):[phone]}   Evaluation Performed:  Follow-up visit  Date:  01/11/2019   ID:  NUPUR HOHMAN, DOB Oct 28, 1970, MRN [id number]  {Patient [phone]::"Home"}  {Provider Location:[phone]}  PCP:  System, Pcp Not In  Cardiologist:  No primary care provider on file. *** Electrophysiologist:  None   Chief Complaint:  ***  History of Present Illness:    Kelly Buchanan is a 48 y.o. female who presents via audio/video conferencing for a telehealth visit today.    1. Chest pain - last visit described symptoms of chest pain that started about 1 year ago - pressure like pain left chest, can radiate down into arm. No associated symptoms. 5/10 in severity. Typically occurs with stress, not neccesarily exertional. Not exertional, no relation to food - occurs 2-3 times a week. Can last several hours and constant. Last visit was reproducible to palpation.  CAD risk factors: hyperlipidemia  - since last visit symptoms are unchanged. She did not try a course of NSAIDs as recommended.    2. Heart murmur - since last visit complete echo that only showed mild TR - no SOB, no LE edema.    SH: Works as Engineer, structural in [place]    The patient {does/does not:200015} have symptoms concerning for COVID-19 infection (fever, chills, cough, or new shortness of breath).    Past Medical History:  Diagnosis Date  . Hyperlipidemia   . Murmur    Past Surgical History:  Procedure Laterality Date  . APPENDECTOMY  1980's  . TONSILLECTOMY  1999  . TUBAL LIGATION  1997     No outpatient medications have been marked as taking for the 01/11/19 encounter (Appointment) with Arnoldo Lenis, MD.     Allergies:   Patient has no known allergies.   Social History   Tobacco Use  . Smoking status: Never Smoker  . Smokeless tobacco: Never Used  Substance Use Topics  . Alcohol use: No    Alcohol/week: 0.0 standard drinks  .  Drug use: No     Family Hx: The patient's family history includes Alcohol abuse in her father; Cancer in her maternal aunt and sister; Drug abuse in her brother; Heart attack in her father; Heart disease (age of onset: 52) in her father; Hypertension in her father and mother.  ROS:   Please see the history of present illness.    *** All other systems reviewed and are negative.   Prior CV studies:   The following studies were reviewed today:  ***  Labs/Other Tests and Data Reviewed:    EKG:  {EKG:[id number]}  Recent Labs: No results found for requested labs within last 8760 hours.   Recent Lipid Panel Lab Results  Component Value Date/Time   CHOL 224 (H) 09/06/2016 09:28 AM   TRIG 109 09/06/2016 09:28 AM   HDL 55 09/06/2016 09:28 AM   CHOLHDL 4.1 09/06/2016 09:28 AM   LDLCALC 147 (H) 09/06/2016 09:28 AM    Wt Readings from Last 3 Encounters:  08/10/17 201 lb 0.6 oz (91.2 kg)  04/25/17 180 lb 1.3 oz (81.7 kg)  12/26/16 180 lb 1.9 oz (81.7 kg)     Objective:    Vital Signs:  There were no vitals taken for this visit.   Well nourished, well developed female in no*** acute distress. ***  ASSESSMENT & PLAN:    1. ***  COVID-19 Education: The signs and symptoms of COVID-19 were discussed with the patient and how to  seek care for testing (follow up with PCP or arrange E-visit).  ***The importance of social distancing was discussed today.  Time:   Today, I have spent *** minutes with the patient with telehealth technology discussing the above problems.     Medication Adjustments/Labs and Tests Ordered: Current medicines are reviewed at length with the patient today.  Concerns regarding medicines are outlined above.  Tests Ordered: No orders of the defined types were placed in this encounter.  Medication Changes: No orders of the defined types were placed in this encounter.   Disposition:  Follow up {follow up:15908}  Merrily Pew, MD   01/11/2019 9:27 AM    [facility]

## 2019-03-08 ENCOUNTER — Ambulatory Visit: Payer: BLUE CROSS/BLUE SHIELD | Admitting: Family Medicine

## 2019-03-12 ENCOUNTER — Other Ambulatory Visit: Payer: Self-pay

## 2019-03-12 ENCOUNTER — Encounter: Payer: Self-pay | Admitting: Family Medicine

## 2019-03-12 ENCOUNTER — Ambulatory Visit (INDEPENDENT_AMBULATORY_CARE_PROVIDER_SITE_OTHER): Payer: BC Managed Care – PPO | Admitting: Family Medicine

## 2019-03-12 VITALS — BP 136/82 | HR 88 | Temp 98.6°F | Resp 12 | Ht 66.5 in | Wt 185.0 lb

## 2019-03-12 DIAGNOSIS — E785 Hyperlipidemia, unspecified: Secondary | ICD-10-CM | POA: Diagnosis not present

## 2019-03-12 DIAGNOSIS — Z800 Family history of malignant neoplasm of digestive organs: Secondary | ICD-10-CM

## 2019-03-12 DIAGNOSIS — E559 Vitamin D deficiency, unspecified: Secondary | ICD-10-CM | POA: Diagnosis not present

## 2019-03-12 DIAGNOSIS — I10 Essential (primary) hypertension: Secondary | ICD-10-CM | POA: Diagnosis not present

## 2019-03-12 DIAGNOSIS — Z1211 Encounter for screening for malignant neoplasm of colon: Secondary | ICD-10-CM

## 2019-03-12 DIAGNOSIS — E663 Overweight: Secondary | ICD-10-CM | POA: Diagnosis not present

## 2019-03-12 DIAGNOSIS — J3089 Other allergic rhinitis: Secondary | ICD-10-CM

## 2019-03-12 DIAGNOSIS — F419 Anxiety disorder, unspecified: Secondary | ICD-10-CM

## 2019-03-12 MED ORDER — ESCITALOPRAM OXALATE 10 MG PO TABS: 10.0000 mg | ORAL_TABLET | Freq: Every day | ORAL | 1 refills | Status: DC

## 2019-03-12 NOTE — Progress Notes (Signed)
Subjective:     Patient ID: Kelly Buchanan, female   DOB: 20-Apr-1971, 48 y.o.   MRN: [id number]  Kelly Buchanan presents for New Patient (Initial Visit) (establish care)  Kelly Buchanan is a 48 year old female patient who was previously a patient of Dr. Francesca Oman.  Has not been seen since Dr. Meda Coffee left this practice.  She has a previous history of hyperlipidemia and a heart murmur.  Along with vitamin D deficiency.  And some focal hypertension.  Today she presents to establish care with me back in the office.  In discussion of what is going on for like the last year or so.  She reports that she is put on about 30 pounds.  She was on a keto diet last year and she has lost about 30 pounds and is put it back on.  She thinks this is because she is more sedentary with her job.  She works as a Engineer, structural for [employer].  Previously was with the city of [place].  Today her blood pressure is within range.  She reports that her diet is not the best.  She likes to "snack" rather than eat full meals.  Some of her favorite snack she reports are not pepperoni, cheese, popcorn.  She reports that she does eat a decent diet otherwise.  Drinks about 2 cups of coffee a day and has a [employer] every other day.  Mostly because this needs to help her stay awake all night shift.  She will drink a glass or 2 of water a day but that usually rare.    Social: Currently lives with her mother.  Does have 4 girls.  Who she is very close with.  She is expecting 1 grandson here soon.  Not currently married. She reports that she wears her seatbelt does not wear sunscreen.  Reports that she has smoke detectors in her home.  Does not text and drive.  Is not a smoker.  Does not use alcohol either.  Is not currently sexually active.  Reports that she sees the dentist.  Has not had to see an eye doctor.   In review of systems she has not had any fevers or chills, cough or shortness of breath.  Denies chest pain, leg  swelling, palpitations, dizziness, headaches.  Denies polyphagia, polydipsia, polyuria.  Endorses some slight congestion and rhinorrhea secondary to allergies.  And some sleep disturbance secondary to her position as a night shift and fluctuating shift work  In review of systems she reports that she does have some hand and arm numbness on the left side.  She is unsure if it is from wearing her vest at work.  She denies having any neck pain or any trauma or injury to the site.  She reports reports that it comes and goes no rhyme or reason.  Additionally she reports that she cannot reproduce the discomfort or the pain.  She denies that it is excessively worse at night.  She reports is mostly numbness and tingling.  She denies having any palm involvement and or any specific fingers.  Past Medical, Surgical, Social History, Allergies, and Medications have been Reviewed.   Past Medical History:  Diagnosis Date  . Hyperlipidemia   . Murmur    Past Surgical History:  Procedure Laterality Date  . APPENDECTOMY  1980's  . TONSILLECTOMY  1999  . TUBAL LIGATION  1997   Social History   Socioeconomic History  . Marital status: Single  Spouse name: Not on file  . Number of children: 4  . Years of education: 53  . Highest education level: Not on file  Occupational History  . 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[employer]  . Financial resource strain: Not on file  . Food insecurity:    Worry: Not on file    Inability: Not on file  . Transportation needs:    Medical: Not on file    Non-medical: Not on file  Tobacco Use  . Smoking status: Never Smoker  . Smokeless tobacco: Never Used  Substance and Sexual Activity  . Alcohol use: No    Alcohol/week: 0.0 standard drinks  . Drug use: No  . Sexual activity: Not Currently    Birth control/protection: Surgical  Lifestyle  . Physical activity:    Days per week: Not on file    Minutes per session: Not on file  .  Stress: Not on file  Relationships  . Social connections:    Talks on phone: Not on file    Gets together: Not on file    Attends religious service: Not on file    Active member of club or organization: Not on file    Attends meetings of clubs or organizations: Not on file    Relationship status: Not on file  . Intimate partner violence:    Fear of current or ex partner: Not on file    Emotionally abused: Not on file    Physically abused: Not on file    Forced sexual activity: Not on file  Other Topics Concern  . Not on file  Social History Narrative   Lives with 56 yo daughter   police officer    Outpatient Encounter Medications as of 03/12/2019  Medication Sig  . acetaminophen (TYLENOL) 500 MG tablet Take 1,000 mg by mouth every 6 (six) hours as needed.  . cholecalciferol (VITAMIN D) 1000 units tablet Take 2,000 Units by mouth daily.  . [DISCONTINUED] cyclobenzaprine (FLEXERIL) 5 MG tablet Take 1 tablet (5 mg total) by mouth at bedtime. As needed muscle spasm (Patient not taking: Reported on 03/12/2019)  . [DISCONTINUED] HYDROcodone-acetaminophen (NORCO/VICODIN) 5-325 MG tablet Take 1 tablet by mouth every 4 (four) hours as needed. (Patient not taking: Reported on 03/12/2019)  . [DISCONTINUED] naproxen (NAPROSYN) 500 MG tablet Take 1 tablet (500 mg total) by mouth 2 (two) times daily with a meal. (Patient not taking: Reported on 03/12/2019)   No facility-administered encounter medications on file as of 03/12/2019.    No Known Allergies  Review of Systems  Constitutional: Negative for chills and fever.  HENT: Positive for congestion and rhinorrhea.   Eyes: Positive for itching.  Respiratory: Negative for cough and shortness of breath.   Cardiovascular: Negative for chest pain, palpitations and leg swelling.  Gastrointestinal: Negative.   Endocrine: Negative for polydipsia, polyphagia and polyuria.  Genitourinary: Negative.   Musculoskeletal: Negative.   Skin: Negative.    Allergic/Immunologic: Positive for environmental allergies.  Neurological: Negative for dizziness and headaches.  Hematological: Negative.   Psychiatric/Behavioral: Positive for sleep disturbance.  All other systems reviewed and are negative.      Objective:     BP 136/82   Pulse 88   Temp 98.6 F (37 C) (Oral)   Resp 12   Ht 5' 6.5" (1.689 m)   Wt 185 lb 0.6 oz (83.9 kg)   SpO2 98%   BMI 29.42 kg/m   Physical Exam Vitals signs and  nursing note reviewed.  Constitutional:      General: She is awake.     Appearance: Normal appearance. She is well-developed, well-groomed and overweight.  HENT:     Head: Normocephalic and atraumatic.     Right Ear: External ear normal.     Left Ear: External ear normal.     Nose: Nose normal.  Eyes:     General:        Right eye: No discharge.        Left eye: No discharge.     Conjunctiva/sclera: Conjunctivae normal.  Neck:     Musculoskeletal: Normal range of motion.  Cardiovascular:     Rate and Rhythm: Normal rate and regular rhythm.     Pulses: Normal pulses.     Heart sounds: Normal heart sounds.  Pulmonary:     Effort: Pulmonary effort is normal.     Breath sounds: Normal breath sounds.  Abdominal:     General: Bowel sounds are normal.  Musculoskeletal: Normal range of motion.  Skin:    General: Skin is warm and dry.  Neurological:     Mental Status: She is alert and oriented to person, place, and time.     Sensory: Sensation is intact.     Motor: Motor function is intact.     Coordination: Coordination is intact.     Gait: Gait is intact.  Psychiatric:        Attention and Perception: Attention and perception normal.        Mood and Affect: Mood and affect normal.        Speech: Speech normal.        Behavior: Behavior normal. Behavior is cooperative.        Thought Content: Thought content normal.        Cognition and Memory: Cognition and memory normal.        Judgment: Judgment normal.        Assessment and  Plan       1. Overweight (BMI 25.0-29.9) Uncontrolled, will be assessing labs that she has not had labs drawn in a while.  Was on a keto diet last year and lost about 3040 pounds.  Reported that she did not like that too much.  Just wants to eat healthier and just be more active to get into better shape.  Educated about possibility of lifestyle changes including food and increasing activity a 30 minutes on most days of the week.  Such as with walking.Patient acknowledged agreement and understanding of the plan.     - CBC with Differential/Platelet - COMPLETE METABOLIC PANEL WITH GFR - Hemoglobin A1c - Lipid panel - TSH  2. Hyperlipidemia, unspecified hyperlipidemia type Previously demonstrated control.  Not on any medication at this time.  Will assess with labs.  - Hemoglobin A1c - Lipid panel  3. Essential hypertension Controlled today in the office.  Still on the higher side 136/82.  Would like to see her closer to the 120 over the 70s.  Educated on the reduction of salt in her diet.  And weight loss along with physical activity increasing 30 minutes on most days of the week.Patient acknowledged agreement and understanding of the plan.    - CBC with Differential/Platelet - COMPLETE METABOLIC PANEL WITH GFR  4. Vitamin D deficiency Previous history of this.  Has been taken vitamin D regularly.  We will be assessing to see if vitamin D is supplementing her enough.  Educated on the use of diet to  increase her dietary intake of vitamin D.  Along with getting some sunshine to help with natural occurrence of vitamin D in the system.  - VITAMIN D 25 Hydroxy (Vit-D Deficiency, Fractures)  5. Anxious mood Reports that she has had some slight increase of anxiety.  Unable to sleep very well.  Is on shift work.  We will be assessing thyroid and starting a SSRI to see if this will help with some of her anxiety and help her sleep better.  Educated on the fact that she needs to stay on the medicine  for up to 3 months to see if this got a benefit or work for her.   Reviewed side effects, risks and benefits of medication.   Patient acknowledged agreement and understanding of the plan.    - TSH - escitalopram (LEXAPRO) 10 MG tablet; Take 1 tablet (10 mg total) by mouth daily.  Dispense: 90 tablet; Refill: 1  6. Family history of rectal cancer Mother was recently diagnosed with rectal cancer she is now cancer free and after treatment.  We will be assessing a Cologuard to make sure that she does not have any signs or symptoms at this time.  - Cologuard  7. Special screening for malignant neoplasms, colon Family history of having rectal cancer.  We will be assessing Cologuard pending Cologuard findings might need to refer to GI for colonoscopy.  Has no signs or symptoms of bleeding in her stool at this time.  No signs and symptoms of constipation or excessive diarrhea, or flatulence.Patient acknowledged agreement and understanding of the plan.    - Cologuard  8.  Allergies Uncontrolled, educated on the use of Claritin and/or Zyrtec to see if these will help with some of her symptoms. Patient acknowledged agreement and understanding of the plan.    Follow Up: 06/12/2019       [name], DNP, AGNP-BC [place], [address], [place] [address] Office Hours: Mon-Thurs 8 am-5 pm; Fri 8 am-12 pm Office Phone:  [phone]  Office Fax: [phone]

## 2019-03-12 NOTE — Patient Instructions (Signed)
    Thank you for coming into the office today. I appreciate the opportunity to provide you with the care for your health and wellness. Today we discussed: overall health  Follow up in 3 months  Pick up Claritin or Zyrtec to at [facility] get 1 month supply and test out.   We will set up Cologuard screening for you.  I will be placing labs for you to get 1 week prior to next appt.   Please start your anxiety medication. Takes as directed.   Place a photo of your girls in your car. On the back write my motivation and discipline.  Vary your snacks daily.  Work to [employer] intake. Look in to carbonated waters AHA, [place]  [place] YOUR HANDS WELL AND FREQUENTLY. AVOID TOUCHING YOUR FACE, UNLESS YOUR HANDS ARE FRESHLY WASHED.  GET FRESH AIR DAILY. STAY HYDRATED WITH WATER.   It was a pleasure to see you and I look forward to continuing to work together on your health and well-being. Please do not hesitate to call the office if you need care or have questions about your care.  Have a wonderful day and week. With Gratitude, Cherly Beach, DNP, AGNP-BC

## 2019-03-25 ENCOUNTER — Emergency Department (HOSPITAL_COMMUNITY): Payer: BC Managed Care – PPO

## 2019-03-25 ENCOUNTER — Other Ambulatory Visit: Payer: Self-pay

## 2019-03-25 ENCOUNTER — Emergency Department (HOSPITAL_COMMUNITY)
Admission: EM | Admit: 2019-03-25 | Discharge: 2019-03-26 | Disposition: A | Discharge status: Home / Self Care | Payer: BC Managed Care – PPO | Attending: Emergency Medicine | Admitting: Emergency Medicine

## 2019-03-25 ENCOUNTER — Encounter (HOSPITAL_COMMUNITY): Payer: Self-pay | Admitting: *Deleted

## 2019-03-25 DIAGNOSIS — R002 Palpitations: Secondary | ICD-10-CM | POA: Insufficient documentation

## 2019-03-25 DIAGNOSIS — Z79899 Other long term (current) drug therapy: Secondary | ICD-10-CM | POA: Diagnosis not present

## 2019-03-25 DIAGNOSIS — R202 Paresthesia of skin: Secondary | ICD-10-CM

## 2019-03-25 DIAGNOSIS — Z20828 Contact with and (suspected) exposure to other viral communicable diseases: Secondary | ICD-10-CM | POA: Insufficient documentation

## 2019-03-25 DIAGNOSIS — R05 Cough: Secondary | ICD-10-CM | POA: Diagnosis not present

## 2019-03-25 MED ORDER — SODIUM CHLORIDE 0.9% FLUSH: 3.0000 mL | Freq: Once | INTRAVENOUS | Status: DC

## 2019-03-25 NOTE — ED Triage Notes (Signed)
Pt with nausea, left arm numbness, heart racing since couple hours ago.

## 2019-03-25 NOTE — ED Notes (Signed)
Pt states the left arm numbness is on and off for a while (did not just start today)! Pt states her anxiety is high because daughter is [redacted] weeks pregnant. Pt is in [employer] and is afraid of contracting covid with daughter being pregnant.

## 2019-03-25 NOTE — ED Provider Notes (Signed)
[facility] EMERGENCY DEPARTMENT Provider Note   CSN: [id number] Arrival date & time: 03/25/19  2039     History   Chief Complaint Chief Complaint  Patient presents with  . Numbness    HPI Kelly Buchanan is a 48 y.o. female.     Patient presents to the emergency department for evaluation of multiple problems.  Patient reports that she has been having intermittent episodes of left arm numbness and tingling.  This has been ongoing for some time.  She reports that it will happen for unknown reasons.  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  This lasts for a while and then resolves.  She reports, however, that she is mainly here because she is very anxious about the possibility of her having COVID.  She reports that a coworker was recently diagnosed.  She does not have close contact with this coworker but she has noticed that she has been coughing for the last 1 or 2 weeks.  No shortness of breath.  No fever.  Patient reports that her daughter is [redacted] weeks pregnant and she is anxious that she might pass COVID onto her.     Past Medical History:  Diagnosis Date  . Hyperlipidemia   . Murmur     Patient Active Problem List   Diagnosis Date Noted  . Vitamin D deficiency 09/07/2016  . Heart murmur 09/06/2016  . Hyperlipidemia 09/06/2016  . Hyperhidrosis of axilla 09/06/2016    Past Surgical History:  Procedure Laterality Date  . APPENDECTOMY  1980's  . TONSILLECTOMY  1999  . TUBAL LIGATION  1997     OB History   No obstetric history on file.      Home Medications    Prior to Admission medications   Medication Sig Start Date End Date Taking? Authorizing Provider  acetaminophen (TYLENOL) 500 MG tablet Take 1,000 mg by mouth every 6 (six) hours as needed.   Yes [provider]  cetirizine (ZYRTEC) 10 MG tablet Take 10 mg by mouth daily as needed for allergies.   Yes [provider]  Cholecalciferol  (VITAMIN D) 50 MCG (2000 UT) CAPS Take 2,000 Units by mouth daily.   Yes [provider]  escitalopram (LEXAPRO) 10 MG tablet Take 1 tablet (10 mg total) by mouth daily. 03/12/19  Yes [name], NP  Multiple Vitamin (MULTIVITAMIN WITH MINERALS) TABS tablet Take 1 tablet by mouth daily.   Yes [provider]    Family History Family History  Problem Relation Age of Onset  . Hypertension Mother   . Cancer Mother 26       rectal CA   . Heart disease Father 52       died at 21  . Heart attack Father   . Hypertension Father   . Alcohol abuse Father   . Cancer Sister        hodgkins  . Hypertension Sister   . Obesity Sister   . Cancer Maternal Aunt     Social History Social History   Tobacco Use  . Smoking status: Never Smoker  . Smokeless tobacco: Never Used  Substance Use Topics  . Alcohol use: No    Alcohol/week: 0.0 standard drinks  . Drug use: No     Allergies   Patient has no known allergies.   Review of Systems Review of Systems  Respiratory: Positive for cough.   Neurological: Positive for  numbness.  All other systems reviewed and are negative.    Physical Exam Updated Vital Signs BP (!) 141/81   Pulse 67   Temp 98.1 F (36.7 C) (Oral)   Resp 15   Ht 5\' 7"  (1.702 m)   Wt 83.9 kg   LMP 03/11/2019   SpO2 100%   BMI 28.98 kg/m   Physical Exam Vitals signs and nursing note reviewed.  Constitutional:      General: She is not in acute distress.    Appearance: Normal appearance. She is well-developed.  HENT:     Head: Normocephalic and atraumatic.     Right Ear: Hearing normal.     Left Ear: Hearing normal.     Nose: Nose normal.  Eyes:     Conjunctiva/sclera: Conjunctivae normal.     Pupils: Pupils are equal, round, and reactive to light.  Neck:     Musculoskeletal: Normal range of motion and neck supple.  Cardiovascular:     Rate and Rhythm: Regular rhythm.     Heart sounds: S1 normal and S2 normal. No murmur. No  friction rub. No gallop.   Pulmonary:     Effort: Pulmonary effort is normal. No respiratory distress.     Breath sounds: Normal breath sounds.  Chest:     Chest wall: No tenderness.  Abdominal:     General: Bowel sounds are normal.     Palpations: Abdomen is soft.     Tenderness: There is no abdominal tenderness. There is no guarding or rebound. Negative signs include Murphy's sign and McBurney's sign.     Hernia: No hernia is present.  Musculoskeletal: Normal range of motion.  Skin:    General: Skin is warm and dry.     Findings: No rash.  Neurological:     Mental Status: She is alert and oriented to person, place, and time.     GCS: GCS eye subscore is 4. GCS verbal subscore is 5. GCS motor subscore is 6.     Cranial Nerves: No cranial nerve deficit.     Sensory: No sensory deficit.     Coordination: Coordination normal.  Psychiatric:        Speech: Speech normal.        Behavior: Behavior normal.        Thought Content: Thought content normal.      ED Treatments / Results  Labs (all labs ordered are listed, but only abnormal results are displayed) Labs Reviewed  SARS CORONAVIRUS 2 (HOSPITAL ORDER, [facility])  BASIC METABOLIC PANEL  CBC  TROPONIN I  POC URINE PREG, ED    EKG    Radiology No results found.  Procedures Procedures (including critical care time)  Medications Ordered in ED Medications  sodium chloride flush (NS) 0.9 % injection 3 mL (has no administration in time range)     Initial Impression / Assessment and Plan / ED Course  I have reviewed the triage vital signs and the nursing notes.  Pertinent labs & imaging results that were available during my care of the patient were reviewed by me and considered in my medical decision making (see chart for details).        Patient presents to the emergency department for evaluation of multiple problems.  She is complaining of left arm numbness and tingling that occurs  intermittently.  It is unclear what causes or alleviates the symptoms.  As it has been ongoing for months, it is unlikely that this  is due to a central nervous system problem.  It rapidly comes on and off, not consistent with a TIA, CVA, demyelinating disease.  This is likely peripheral in nature.  Difficult to evaluate currently because symptoms are not present.  Will need possible outpatient follow-up.  Patient reports palpitations and racing heartbeat earlier today.  She is in sinus rhythm on the monitor, heart rate in the 60s and 70s.  This was likely secondary to her anxiety.  We will not require any further work-up.  Patient admits that she is mainly here today because of anxiety over coronavirus. Tested negative today.  Final Clinical Impressions(s) / ED Diagnoses   Final diagnoses:  Rapid palpitations    ED Discharge Orders    None       Orpah Greek, MD 03/25/19 2356

## 2019-03-26 LAB — CBC
HCT: 38.8 % (ref 36.0–46.0)
Hemoglobin: 13.2 g/dL (ref 12.0–15.0)
MCH: 30.1 pg (ref 26.0–34.0)
MCHC: 34 g/dL (ref 30.0–36.0)
MCV: 88.6 fL (ref 80.0–100.0)
Platelets: 322 10*3/uL (ref 150–400)
RBC: 4.38 MIL/uL (ref 3.87–5.11)
RDW: 12.5 % (ref 11.5–15.5)
WBC: 6.6 10*3/uL (ref 4.0–10.5)
nRBC: 0 % (ref 0.0–0.2)

## 2019-03-26 LAB — BASIC METABOLIC PANEL
Anion gap: 10 (ref 5–15)
BUN: 12 mg/dL (ref 6–20)
CO2: 26 mmol/L (ref 22–32)
Calcium: 9.2 mg/dL (ref 8.9–10.3)
Chloride: 102 mmol/L (ref 98–111)
Creatinine, Ser: 0.68 mg/dL (ref 0.44–1.00)
GFR calc Af Amer: >60 mL/min (ref >60)
GFR calc non Af Amer: >60 mL/min (ref >60)
Glucose, Bld: 93 mg/dL (ref 70–99)
Potassium: 3.8 mmol/L (ref 3.5–5.1)
Sodium: 138 mmol/L (ref 135–145)

## 2019-03-26 LAB — TROPONIN I: Troponin I: <0.03 ng/mL (ref <0.03)

## 2019-03-26 LAB — SARS CORONAVIRUS 2 BY RT PCR (HOSPITAL ORDER, PERFORMED IN ~~LOC~~ HOSPITAL LAB): SARS Coronavirus 2: NEGATIVE

## 2019-05-14 ENCOUNTER — Encounter: Payer: Self-pay | Admitting: Family Medicine

## 2019-05-14 ENCOUNTER — Telehealth (INDEPENDENT_AMBULATORY_CARE_PROVIDER_SITE_OTHER): Payer: BC Managed Care – PPO | Admitting: Family Medicine

## 2019-05-14 ENCOUNTER — Other Ambulatory Visit: Payer: Self-pay

## 2019-05-14 VITALS — Ht 66.5 in | Wt 185.0 lb

## 2019-05-14 DIAGNOSIS — R52 Pain, unspecified: Secondary | ICD-10-CM | POA: Diagnosis not present

## 2019-05-14 DIAGNOSIS — J00 Acute nasopharyngitis [common cold]: Secondary | ICD-10-CM

## 2019-05-14 DIAGNOSIS — Z20822 Contact with and (suspected) exposure to covid-19: Secondary | ICD-10-CM

## 2019-05-14 NOTE — Patient Instructions (Signed)
    Thank you for coming into the office today. I appreciate the opportunity to provide you with the care for your health and wellness. Today we discussed: bodyaches, feverish, and cold symptoms  Follow Up: as needed---work noted given  Next appt: 06/12/2019  No labs today  Continue over the counter symptom management with tylenol and cold medications. Hydrate, get fresh fruits and veggies, and rest.  Can get testing at [facility] site if symptoms continue.  Remember if you have increase chest tightness or work of breathing please go to the nearest ED. If symptoms worsen or fail to improve please call the office back  Please continue to practice social distancing to keep you, your family, and our community safe.  If you must go out, please wear a Mask and practice good handwashing.  [place] YOUR HANDS WELL AND FREQUENTLY. AVOID TOUCHING YOUR FACE, UNLESS YOUR HANDS ARE FRESHLY WASHED.  GET FRESH AIR DAILY. STAY HYDRATED WITH WATER.   It was a pleasure to see you and I look forward to continuing to work together on your health and well-being. Please do not hesitate to call the office if you need care or have questions about your care.  Have a wonderful day and week. With Gratitude, Cherly Beach, DNP, AGNP-BC

## 2019-05-14 NOTE — Progress Notes (Signed)
Virtual Visit via Telephone Note   This visit type was conducted due to national recommendations for restrictions regarding the COVID-19 Pandemic (e.g. social distancing) in an effort to limit this patient's exposure and mitigate transmission in our community.  Due to her co-morbid illnesses, this patient is at least at moderate risk for complications without adequate follow up.  This format is felt to be most appropriate for this patient at this time.  The patient did not have access to video technology/had technical difficulties with video requiring transitioning to audio format only (telephone).  All issues noted in this document were discussed and addressed.  No physical exam could be performed with this format.    Evaluation Performed:  Follow-up visit  Date:  05/14/2019   ID:  Kelly Buchanan, DOB [birth year], MRN [id number]  Patient Location: Home Provider Location: Office  Location of Patient: Home Location of Provider: Telehealth Consent was obtain for visit to be over via telehealth. I verified that I am speaking with the correct person using two identifiers.  PCP:  [name], NP   Chief Complaint:    History of Present Illness:    Kelly Buchanan is a 48 y.o. female of mine. She presents today with feeling exhausted, COVID test on Aug 4th was negative. Saturday she started having a lot of body aches, tightness in chest, headaches. Tried Vicks vapor rub. Slept all day. Took allher current medications, including allergy, tylenol, and some cold meds. Plus night quil. Woke up with feverish/sweaty. No temp taken at home.  Reports daughter is feeling bad as well. Grandson was sent away he is only 33 weeks old they do not want him sick. Recent exposure to someone with a virus. Probably common cold she thinks.  Reports some drainage-white. Slight headache but getting better. Overall she is feeling better, but is just not up for working yet, and would like to get retested for  COVID before returning to work or having grandson come home.  She reports she is going to go herself to one of the [facility] test sites.  The patient does have some symptoms concerning for COVID-19 infection (fever, chills, cough, or new shortness of breath).   Past Medical, Surgical, Social History, Allergies, and Medications have been Reviewed.   Past Medical History:  Diagnosis Date  . Hyperlipidemia   . Murmur    Past Surgical History:  Procedure Laterality Date  . APPENDECTOMY  1980's  . TONSILLECTOMY  1999  . TUBAL LIGATION  1997     Current Meds  Medication Sig  . acetaminophen (TYLENOL) 500 MG tablet Take 1,000 mg by mouth every 6 (six) hours as needed.  . cetirizine (ZYRTEC) 10 MG tablet Take 10 mg by mouth daily as needed for allergies.  . Cholecalciferol (VITAMIN D) 50 MCG (2000 UT) CAPS Take 2,000 Units by mouth daily.  Marland Kitchen escitalopram (LEXAPRO) 10 MG tablet Take 1 tablet (10 mg total) by mouth daily.  . Multiple Vitamin (MULTIVITAMIN WITH MINERALS) TABS tablet Take 1 tablet by mouth daily.     Allergies:   Patient has no known allergies.   Social History   Tobacco Use  . Smoking status: Never Smoker  . Smokeless tobacco: Never Used  Substance Use Topics  . Alcohol use: No    Alcohol/week: 0.0 standard drinks  . Drug use: No     Family Hx: The patient's family history includes Alcohol abuse in her father; Cancer in her maternal aunt and  sister; Cancer (age of onset: 12) in her mother; Heart attack in her father; Heart disease (age of onset: 83) in her father; Hypertension in her father, mother, and sister; Obesity in her sister.  ROS:   Please see the history of present illness.    All other systems reviewed and are negative.   Labs/Other Tests and Data Reviewed:    Recent Labs: 03/25/2019: BUN 12; Creatinine, Ser 0.68; Hemoglobin 13.2; Platelets 322; Potassium 3.8; Sodium 138   Recent Lipid Panel Lab Results  Component Value Date/Time   CHOL 224 (H)  09/06/2016 09:28 AM   TRIG 109 09/06/2016 09:28 AM   HDL 55 09/06/2016 09:28 AM   CHOLHDL 4.1 09/06/2016 09:28 AM   LDLCALC 147 (H) 09/06/2016 09:28 AM    Wt Readings from Last 3 Encounters:  05/14/19 185 lb (83.9 kg)  03/25/19 185 lb (83.9 kg)  03/12/19 185 lb 0.6 oz (83.9 kg)     Objective:    Vital Signs:  Ht 5' 6.5" (1.689 m)   Wt 185 lb (83.9 kg)   BMI 29.41 kg/m    GEN:  alert and oriented RESPIRATORY:  no noted shortness of breath PSYCH:  normal mood and affect   ASSESSMENT & PLAN:     1. Body aches Continue tylenol, hydration, and OTC txs  COVID neg on 8/4. Could be a false negative.  Advised of symptoms in which to go to the ED.  2. Common cold virus Reports being around someone with a "cold virus"  Was tested for COVID and came back negative 05/07/2019 S&S are consistent with COVID.  Encourage rest, hydration, and OTC symptom management.    Time:   Today, I have spent 10 minutes with the patient with telehealth technology discussing the above problems.     Medication Adjustments/Labs and Tests Ordered: Current medicines are reviewed at length with the patient today.  Concerns regarding medicines are outlined above.   Tests Ordered: No orders of the defined types were placed in this encounter.   Medication Changes: No orders of the defined types were placed in this encounter.   Disposition:  Follow up as needed  Signed, [name], NP  05/14/2019 10:02 AM     [place]

## 2019-05-15 LAB — NOVEL CORONAVIRUS, NAA: SARS-CoV-2, NAA: NOT DETECTED

## 2019-05-16 ENCOUNTER — Telehealth: Payer: Self-pay | Admitting: Family Medicine

## 2019-05-16 NOTE — Telephone Encounter (Signed)
Pt  Needs a release to go back to work, as she was tested for covid and it was negative, but she is still weak   I advised Pt that Jarrett Soho was not in the office today, but would be back in on Friday from 8-12

## 2019-05-17 ENCOUNTER — Telehealth: Payer: Self-pay | Admitting: *Deleted

## 2019-05-17 ENCOUNTER — Encounter: Payer: Self-pay | Admitting: Family Medicine

## 2019-05-17 NOTE — Telephone Encounter (Signed)
Pt was calling needing a note to return to back to work. They will not let her come back without a note. She was supposed to return tonight however she is still feeling weak would prefer to return tomorrow

## 2019-05-17 NOTE — Telephone Encounter (Signed)
Note written and placed in brown folder up front

## 2019-05-17 NOTE — Telephone Encounter (Signed)
Note written for work and placed in brown folder up front

## 2019-06-12 ENCOUNTER — Other Ambulatory Visit: Payer: Self-pay

## 2019-06-12 ENCOUNTER — Encounter: Payer: Self-pay | Admitting: Family Medicine

## 2019-06-12 ENCOUNTER — Ambulatory Visit (INDEPENDENT_AMBULATORY_CARE_PROVIDER_SITE_OTHER): Payer: BC Managed Care – PPO | Admitting: Family Medicine

## 2019-06-12 VITALS — Ht 67.0 in | Wt 185.0 lb

## 2019-06-12 DIAGNOSIS — E663 Overweight: Secondary | ICD-10-CM

## 2019-06-12 DIAGNOSIS — E785 Hyperlipidemia, unspecified: Secondary | ICD-10-CM | POA: Diagnosis not present

## 2019-06-12 DIAGNOSIS — F419 Anxiety disorder, unspecified: Secondary | ICD-10-CM | POA: Diagnosis not present

## 2019-06-12 DIAGNOSIS — M79642 Pain in left hand: Secondary | ICD-10-CM

## 2019-06-12 DIAGNOSIS — M79641 Pain in right hand: Secondary | ICD-10-CM

## 2019-06-12 NOTE — Progress Notes (Signed)
Virtual Visit via Telephone Note   This visit type was conducted due to national recommendations for restrictions regarding the COVID-19 Pandemic (e.g. social distancing) in an effort to limit this patient's exposure and mitigate transmission in our community.  Due to her co-morbid illnesses, this patient is at least at moderate risk for complications without adequate follow up.  This format is felt to be most appropriate for this patient at this time.  The patient did not have access to video technology/had technical difficulties with video requiring transitioning to audio format only (telephone).  All issues noted in this document were discussed and addressed.  No physical exam could be performed with this format.    Evaluation Performed:  Follow-up visit  Date:  06/12/2019   ID:  Kelly Buchanan, DOB 1971/08/17, MRN [id number]  Patient Location: Home Provider Location: Office  Location of Patient: Home Location of Provider: Telehealth Consent was obtain for visit to be over via telehealth. I verified that I am speaking with the correct person using two identifiers.  PCP:  [name], NP   Chief Complaint:  Follow up Anxiety   History of Present Illness:    Kelly Buchanan is a 48 y.o. female with History of heart murmur, hyperlipidemia, allergies, anxiety. Presents today for follow-up regarding anxiety.  Has been taking Lexapro 10 mg for the last couple months is doing really well with this and knows that she would like to stay on it at this time.  Additionally she feels allergy medication is helping her as well she takes the Zyrtec as needed.  Continues to be on vitamin D.  And a multivitamin.  Would like to come in for flu shot appointment made for Monday 9/14.  Only concern today is that she has achiness in her hands bilaterally.  She is unsure if this is from gripping steering well as she has to drive through campus at [place].  Or if it is because she has to grip stairs going  up stairs multiple times a day. This is somewhat new she is not had this issue before and she has worked a long for a while now.  She reports that she thinks her first couple of finger digits middle digits are a little bit bigger and wider they are not red fingers are straight and they are not inflamed per her.  This is a phone visit unable to assess in person.  She reports she is been taking some Tylenol and that seems to help some.  Still able to move all of her fingers with range of motion.  She denies injury or trauma.  No family history of rheumatoid arthritis or arthritis that she knows of.  The patient does not have symptoms concerning for COVID-19 infection (fever, chills, cough, or new shortness of breath).   Past Medical, Surgical, Social History, Allergies, and Medications have been Reviewed.  Past Medical History:  Diagnosis Date   Hyperlipidemia    Murmur    Past Surgical History:  Procedure Laterality Date   APPENDECTOMY  1980's   TONSILLECTOMY  1999   TUBAL LIGATION  1997     Current Meds  Medication Sig   acetaminophen (TYLENOL) 500 MG tablet Take 1,000 mg by mouth every 6 (six) hours as needed.   cetirizine (ZYRTEC) 10 MG tablet Take 10 mg by mouth daily as needed for allergies.   Cholecalciferol (VITAMIN D) 50 MCG (2000 UT) CAPS Take 2,000 Units by mouth daily.  escitalopram (LEXAPRO) 10 MG tablet Take 1 tablet (10 mg total) by mouth daily.   Multiple Vitamin (MULTIVITAMIN WITH MINERALS) TABS tablet Take 1 tablet by mouth daily.     Allergies:   Patient has no known allergies.   Social History   Tobacco Use   Smoking status: Never Smoker   Smokeless tobacco: Never Used  Substance Use Topics   Alcohol use: No    Alcohol/week: 0.0 standard drinks   Drug use: No     Family Hx: The patient's family history includes Alcohol abuse in her father; Cancer in her maternal aunt and sister; Cancer (age of onset: 41) in her mother; Heart attack in her  father; Heart disease (age of onset: 27) in her father; Hypertension in her father, mother, and sister; Obesity in her sister.  ROS:   Please see the history of present illness.    All other systems reviewed and are negative.   Labs/Other Tests and Data Reviewed:    Recent Labs: 03/25/2019: BUN 12; Creatinine, Ser 0.68; Hemoglobin 13.2; Platelets 322; Potassium 3.8; Sodium 138   Recent Lipid Panel Lab Results  Component Value Date/Time   CHOL 224 (H) 09/06/2016 09:28 AM   TRIG 109 09/06/2016 09:28 AM   HDL 55 09/06/2016 09:28 AM   CHOLHDL 4.1 09/06/2016 09:28 AM   LDLCALC 147 (H) 09/06/2016 09:28 AM    Wt Readings from Last 3 Encounters:  06/12/19 185 lb (83.9 kg)  05/14/19 185 lb (83.9 kg)  03/25/19 185 lb (83.9 kg)     Objective:    Vital Signs:  Ht 5\' 7"  (1.702 m)    Wt 185 lb (83.9 kg)    BMI 28.98 kg/m    GEN:  Alert and oriented RESPIRATORY:  No shortness of breath in conversation over the phone PSYCH:  Normal affect and mood  ASSESSMENT & PLAN:    1. Overweight (BMI 25.0-29.9) Unchanged  Kelly Buchanan is re-educated about the importance of exercise daily to help with weight management. A minumum of 30 minutes daily is recommended. Additionally, importance of healthy food choices  with portion control discussed.  Wt Readings from Last 3 Encounters:  06/12/19 185 lb (83.9 kg)  05/14/19 185 lb (83.9 kg)  03/25/19 185 lb (83.9 kg)    2. Bilateral hand pain Unsure if hand pain is related from overuse.  She does not seem to have any signs or symptoms of carpal tunnel.  The way she describes it it sounds like her first couple fingers are involved with the gripping mechanism of issue.  She also reports that it is an achy-like feeling.  And is worse at certain times of the day.  Advised her to take Tylenol as this is #1 for arthritis.  I told her she could take ibuprofen if she felt like she was having some inflammation in the joint.  She does have good kidney  function and her blood pressures controlled.  Advised for her not to take either of these ongoing daily.  And if she does start to take them daily that she needs to get in contact with me so that I can either get an x-ray or some lab work to see if there is something going on with the joints. Patient acknowledged agreement and understanding of the plan.    3. Hyperlipidemia, unspecified hyperlipidemia type Previous history of elevated cholesterol.  She is working on her diet at this time.  Advised for her to continue to work on this next  visit will get a new update of her labs. Patient acknowledged agreement and understanding of the plan.    4. Anxious mood She reports that she is doing much better with the Lexapro.  Sleeping a little bit better as well.  She would like to stay on this medication for a little bit longer.  No refills needed at this time.   Time:   Today, I have spent 10 minutes with the patient with telehealth technology discussing the above problems.     Medication Adjustments/Labs and Tests Ordered: Current medicines are reviewed at length with the patient today.  Concerns regarding medicines are outlined above.   Tests Ordered: No orders of the defined types were placed in this encounter.   Medication Changes: No orders of the defined types were placed in this encounter.   Disposition:  Follow up in 4 month(s)   Signed, [name], NP  06/12/2019 8:33 AM     [place]

## 2019-06-12 NOTE — Patient Instructions (Signed)
    Thank you for completing your visit via telephone I appreciate the opportunity to provide you with the care for your health and wellness. Today we discussed: Hand pain, flu shot, overall health  Follow-up: 4 months or as needed  No labs today, if you have an early morning appointment for your next visit we will probably look at getting some fasting labs.  Please continue to stay on all your current medications including vitamin D, multivitamin, Lexapro, Zyrtec as needed, Tylenol as we discussed for your discomfort in your hands.  Ibuprofen as needed.  Please if you feel like you are taking pain medicine over-the-counter daily and you are still having discomfort or you are having to take it daily please get back a hold of me.  As I do not want you to be doing that at this time.  Would like to figure out if there is something else going on with your joints.  As previously stated we can look at getting labs and/or x-rays.  Please continue to practice social distancing to keep you, your family, and our community safe.  If you must go out, please wear a Mask and practice good handwashing.  Elmer YOUR HANDS WELL AND FREQUENTLY. AVOID TOUCHING YOUR FACE, UNLESS YOUR HANDS ARE FRESHLY WASHED.  GET FRESH AIR DAILY. STAY HYDRATED WITH WATER.   It was a pleasure to see you and I look forward to continuing to work together on your health and well-being. Please do not hesitate to call the office if you need care or have questions about your care.  Have a wonderful day and week. With Gratitude, Cherly Beach, DNP, AGNP-BC

## 2019-06-17 ENCOUNTER — Ambulatory Visit (INDEPENDENT_AMBULATORY_CARE_PROVIDER_SITE_OTHER): Payer: BC Managed Care – PPO

## 2019-06-17 ENCOUNTER — Other Ambulatory Visit: Payer: Self-pay

## 2019-06-17 ENCOUNTER — Telehealth: Payer: Self-pay | Admitting: Family Medicine

## 2019-06-17 DIAGNOSIS — Z23 Encounter for immunization: Secondary | ICD-10-CM

## 2019-06-17 NOTE — Telephone Encounter (Signed)
Pt stopped in to ask for cough medicine she states that the over the counter medication is not working

## 2019-06-17 NOTE — Telephone Encounter (Signed)
States that she just had a visit and that you were aware but I didn't see any notes addressing it. Said she had been coughing up mucus and taking mucinex but still had phlegm stuck in her throat and it wasn't getting better.  Patient aware Kelly Buchanan not in office today and this would be addressed Tuesday

## 2019-06-18 ENCOUNTER — Other Ambulatory Visit: Payer: Self-pay | Admitting: Family Medicine

## 2019-06-18 DIAGNOSIS — R05 Cough: Secondary | ICD-10-CM

## 2019-06-18 DIAGNOSIS — R059 Cough, unspecified: Secondary | ICD-10-CM

## 2019-06-18 MED ORDER — GUAIFENESIN-DM 100-10 MG/5ML PO SYRP: 5.0000 mL | ORAL_SOLUTION | ORAL | 0 refills | Status: DC | PRN

## 2019-06-18 MED ORDER — BENZONATATE 100 MG PO CAPS: 100.0000 mg | ORAL_CAPSULE | Freq: Three times a day (TID) | ORAL | 0 refills | Status: DC | PRN

## 2019-07-24 ENCOUNTER — Ambulatory Visit (HOSPITAL_COMMUNITY)
Admission: RE | Admit: 2019-07-24 | Discharge: 2019-07-24 | Disposition: A | Discharge status: Home / Self Care | Payer: BC Managed Care – PPO | Source: Ambulatory Visit | Attending: Family Medicine | Admitting: Family Medicine

## 2019-07-24 ENCOUNTER — Other Ambulatory Visit: Payer: Self-pay

## 2019-07-24 ENCOUNTER — Encounter: Payer: Self-pay | Admitting: Family Medicine

## 2019-07-24 ENCOUNTER — Encounter (HOSPITAL_COMMUNITY): Payer: Self-pay

## 2019-07-24 ENCOUNTER — Ambulatory Visit (INDEPENDENT_AMBULATORY_CARE_PROVIDER_SITE_OTHER): Payer: BC Managed Care – PPO | Admitting: Family Medicine

## 2019-07-24 VITALS — BP 136/84 | HR 85 | Temp 99.0°F | Resp 15 | Ht 67.0 in | Wt 187.0 lb

## 2019-07-24 DIAGNOSIS — M25462 Effusion, left knee: Secondary | ICD-10-CM | POA: Diagnosis not present

## 2019-07-24 DIAGNOSIS — M7989 Other specified soft tissue disorders: Secondary | ICD-10-CM | POA: Diagnosis not present

## 2019-07-24 DIAGNOSIS — E663 Overweight: Secondary | ICD-10-CM

## 2019-07-24 DIAGNOSIS — M25562 Pain in left knee: Secondary | ICD-10-CM | POA: Insufficient documentation

## 2019-07-24 MED ORDER — METHYLPREDNISOLONE ACETATE 80 MG/ML IJ SUSP
80.0000 mg | Freq: Once | INTRAMUSCULAR | Status: AC
  Administered 2019-07-24: 80 mg via INTRAMUSCULAR

## 2019-07-24 MED ORDER — KETOROLAC TROMETHAMINE 60 MG/2ML IM SOLN
60.0000 mg | Freq: Once | INTRAMUSCULAR | Status: AC
  Administered 2019-07-24: 15:00:00 60 mg via INTRAMUSCULAR

## 2019-07-24 NOTE — Patient Instructions (Addendum)
  I appreciate the opportunity to provide you with the care for your health and wellness. Today we discussed: left knee pain   Follow up: 1 month is not better  Xray today at [facility]  Referral to Ortho due to limited range of motion and swelling in the medial joint line area.  Injections today to help with pain and swelling.  Pending what the xray says we will look at if MRI would be needed before your Ortho appt.  Until then RICE measures:  Apply a compressive ACE bandage. Rest and elevate the affected painful area.  Apply cold compresses intermittently as needed.  As pain recedes, begin normal activities slowly as tolerated.  Call if symptoms persist.  It was a pleasure to see you and I look forward to continuing to work together on your health and well-being. Please do not hesitate to call the office if you need care or have questions about your care.  Have a wonderful day and week. With Gratitude, Cherly Beach, DNP, AGNP-BC

## 2019-07-24 NOTE — Progress Notes (Signed)
Subjective:     Patient ID: Kelly Buchanan, female   DOB: 25-Oct-1970, 48 y.o.   MRN: [id number]  Kelly Buchanan presents for Knee Pain (left knee.)  History of hyperlipidemia and murmur, vitamin D deficiency, hypertension. Reports today after having a 6-day ongoing left knee swelling and pain.  Last Thursday, October 15 she was doing rifle qualifying with work.  Which required her to be on her knee for extended period time the qualifying lasted for about 4 hours.  Upon that night she started having swelling of the knee and limited bending movements.  Reports that she has had MRIs in the past.  Of note 1 in 2006 is noted to be in the results section of her chart show large joint effusion, mild hyalin cartilage irregularity in the medial weightbearing compartment.  This was of the right knee.  I do not note anything to be in on the left knee. She reports that she has the worst time when standing on it or sitting for long periods of time the whole leg swells.  But predominantly just in the knee area.  Has not taken anything for it outside of some ice and Biofreeze.  Reports nothing really relieves it is sent for being off of it for an extended period of time.  Reports discomfort today is 5 out of 10.  Denies having any popping, cracking sound at right foot qualifying.  Reports limited range of motion.  Is unable to squat more due to the fluid in the knee rather than just the pain itself.  Denies having any back trouble hip trouble or other joint related trouble.  Right knee unremarkable at this time.  Today patient denies signs and symptoms of COVID 19 infection including fever, chills, cough, shortness of breath, and headache.  Past Medical, Surgical, Social History, Allergies, and Medications have been Reviewed.   Past Medical History:  Diagnosis Date  . Hyperlipidemia   . Murmur    Past Surgical History:  Procedure Laterality Date  . APPENDECTOMY  1980's  . TONSILLECTOMY  1999  . TUBAL  LIGATION  1997   Social History   Socioeconomic History  . Marital status: Single    Spouse name: Not on file  . Number of children: 4  . Years of education: 7  . Highest education level: Some college, no degree  Occupational History  . 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[employer]  . Financial resource strain: Not hard at all  . Food insecurity    Worry: Never true    Inability: Never true  . Transportation needs    Medical: No    Non-medical: No  Tobacco Use  . Smoking status: Never Smoker  . Smokeless tobacco: Never Used  Substance and Sexual Activity  . Alcohol use: No    Alcohol/week: 0.0 standard drinks  . Drug use: No  . Sexual activity: Not Currently    Birth control/protection: Surgical  Lifestyle  . Physical activity    Days per week: 0 days    Minutes per session: 0 min  . Stress: Rather much  Relationships  . Social connections    Talks on phone: More than three times a week    Gets together: More than three times a week    Attends religious service: 1 to 4 times per year    Active member of club or organization: Not on file    Attends meetings of clubs or  organizations: Never    Relationship status: Never married  . Intimate partner violence    Fear of current or ex partner: No    Emotionally abused: No    Physically abused: No    Forced sexual activity: No  Other Topics Concern  . 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 acetaminophen  (TYLENOL) 500 MG tablet Take 1,000 mg by mouth every 6 (six) hours as needed.  . benzonatate (TESSALON) 100 MG capsule Take 1 capsule (100 mg total) by mouth 3 (three) times daily as needed for cough.  . cetirizine (ZYRTEC) 10 MG tablet Take 10 mg by mouth daily as needed for allergies.  . Cholecalciferol (VITAMIN D) 50 MCG (2000 UT) CAPS Take 2,000 Units by mouth daily.  Marland Kitchen escitalopram (LEXAPRO) 10 MG tablet Take 1 tablet (10 mg total) by mouth daily.  Marland Kitchen guaiFENesin-dextromethorphan (ROBITUSSIN DM) 100-10 MG/5ML syrup Take 5 mLs by mouth every 4 (four) hours as needed for cough.  . Multiple Vitamin (MULTIVITAMIN WITH MINERALS) TABS tablet Take 1 tablet by mouth daily.   No facility-administered encounter medications on file as of 07/24/2019.    No Known Allergies  Review of Systems  HENT: Negative.   Eyes: Negative.   Respiratory: Negative.   Cardiovascular: Negative.   Gastrointestinal: Negative.   Endocrine: Negative.   Genitourinary: Negative.   Musculoskeletal: Positive for arthralgias and joint swelling.  Skin: Negative.   Allergic/Immunologic: Negative.   Neurological: Negative.   Hematological: Negative.   Psychiatric/Behavioral: Negative.   All other systems reviewed and are negative.      Objective:     BP 136/84   Pulse 85   Temp 99 F (37.2 C) (Temporal)   Resp 15   Ht 5\' 7"  (1.702 m)   Wt 187 lb 0.6 oz (84.8 kg)   SpO2 99%   BMI 29.29 kg/m   Physical Exam Vitals signs and nursing note reviewed.  Constitutional:      Appearance: Normal appearance. She is overweight.  HENT:     Head: Normocephalic and atraumatic.     Right Ear: External ear normal.     Left Ear: External ear normal.     Nose: Nose normal.     Mouth/Throat:     Pharynx: Oropharynx is clear.  Eyes:     General:        Right eye: No discharge.        Left eye: No discharge.     Conjunctiva/sclera: Conjunctivae normal.  Neck:     Musculoskeletal: Normal range of motion and neck  supple.  Cardiovascular:     Rate and Rhythm: Normal rate and regular rhythm.     Pulses: Normal pulses.     Heart sounds: Normal heart sounds.  Pulmonary:     Effort: Pulmonary effort is normal.  Breath sounds: Normal breath sounds.  Musculoskeletal:     Right shoulder: Normal.     Left shoulder: Normal.     Right hip: Normal.     Left hip: Normal.     Right knee: Normal.     Left knee: She exhibits decreased range of motion and swelling. She exhibits no ecchymosis, no deformity, no laceration, no erythema, no bony tenderness and no MCL laxity. Tenderness found. Medial joint line tenderness noted.     Right ankle: Normal.     Left ankle: She exhibits swelling. She exhibits normal range of motion.  Skin:    General: Skin is warm.  Neurological:     General: No focal deficit present.     Mental Status: She is alert and oriented to person, place, and time.  Psychiatric:        Mood and Affect: Mood normal.        Behavior: Behavior normal.        Thought Content: Thought content normal.        Judgment: Judgment normal.       Assessment and Plan        1. Pain and swelling of knee, left Signs and symptoms are consistent with possible knee effusion.  Had limited bending motion in office on exam.  Has some swelling noted with bending at the medial line joint.  Tenderness across this area as well.  No tenderness to the patella.  Right knee is unremarkable.  Does not want to take a prednisone Dosepak at this time.  But is willing to get Depo-Medrol and Toradol injections today in the office to help with discomfort swelling and pain.  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  Does not want to have to miss work.  Is out of work until Friday at this time as her days off.  Would like to be able to remain working.  Provided referral to orthopedist secondary to previous effusion noted years back.  And PE findings today.  Appreciate collaboration in her care.  Please let  us know if PCP can be of any help.  - AMB Ortho Referral Placed - DG Knee Complete 4 Views Left; Future - methylPREDNISolone acetate (DEPO-MEDROL) injection 80 mg - ketorolac (TORADOL) injection 60 mg  2. Overweight (BMI 25.0-29.9) MAELYNNE GEISELMAN is re-educated about the importance of exercise daily to help with weight management. A minumum of 30 minutes daily is recommended. Additionally, importance of healthy food choices  with portion control discussed.  Difficulty right now secondary to knee pain.  But advised to stay as active as possible to help with weight management.  Wt Readings from Last 3 Encounters:  07/24/19 187 lb 0.6 oz (84.8 kg)  06/12/19 185 lb (83.9 kg)  05/14/19 185 lb (83.9 kg)   Follow-up: 1 month  [name], DNP, AGNP-BC [address] [place], [place] [address] Office Hours: Mon-Thurs 8 am-5 pm; Fri 8 am-12 pm Office Phone:  [phone]  Office Fax: [phone]

## 2019-07-25 NOTE — Progress Notes (Signed)
Please notify Kelly Buchanan that she does have some mild degenerative changes with possible small joint effusion follow-up with Ortho as recommended.  See if she is also started to feel better secondary to the injection.  Thanks

## 2019-08-21 ENCOUNTER — Ambulatory Visit: Payer: BC Managed Care – PPO | Admitting: Orthopedic Surgery

## 2019-08-21 ENCOUNTER — Other Ambulatory Visit: Payer: Self-pay

## 2019-08-21 VITALS — BP 114/75 | HR 80 | Temp 97.3°F | Ht 67.0 in | Wt 184.0 lb

## 2019-08-21 DIAGNOSIS — M1712 Unilateral primary osteoarthritis, left knee: Secondary | ICD-10-CM

## 2019-08-21 MED ORDER — MELOXICAM 7.5 MG PO TABS: 7.5000 mg | ORAL_TABLET | Freq: Every day | ORAL | 5 refills | Status: DC

## 2019-08-21 NOTE — Patient Instructions (Signed)

## 2019-08-21 NOTE — Progress Notes (Signed)
Kelly Buchanan  08/21/2019  Body mass index is 28.82 kg/m.   HISTORY SECTION :  Chief Complaint  Patient presents with  . Knee Pain    Left knee pain.   HPI The patient presents for evaluation of  (mild/moderate/severe/ ) MODERATE  pain, in the (right /left) LEFT KNEE , for 4 YRS WORSE SINCE OCT  , associated with SWELLING AFTER STANDING FOR LONG PERIODS OF TIME .  Prior treatment : IM Toradol IM Depo-Medrol   Review of Systems  Psychiatric/Behavioral: Positive for depression and suicidal ideas.  All other systems reviewed and are negative.    has a past medical history of Hyperlipidemia and Murmur.   Past Surgical History:  Procedure Laterality Date  . APPENDECTOMY  1980's  . TONSILLECTOMY  1999  . TUBAL LIGATION  1997    Body mass index is 28.82 kg/m.   No Known Allergies   Current Outpatient Medications:  .  acetaminophen (TYLENOL) 500 MG tablet, Take 1,000 mg by mouth every 6 (six) hours as needed., Disp: , Rfl:  .  benzonatate (TESSALON) 100 MG capsule, Take 1 capsule (100 mg total) by mouth 3 (three) times daily as needed for cough., Disp: 20 capsule, Rfl: 0 .  cetirizine (ZYRTEC) 10 MG tablet, Take 10 mg by mouth daily as needed for allergies., Disp: , Rfl:  .  Cholecalciferol (VITAMIN D) 50 MCG (2000 UT) CAPS, Take 2,000 Units by mouth daily., Disp: , Rfl:  .  escitalopram (LEXAPRO) 10 MG tablet, Take 1 tablet (10 mg total) by mouth daily., Disp: 90 tablet, Rfl: 1 .  Multiple Vitamin (MULTIVITAMIN WITH MINERALS) TABS tablet, Take 1 tablet by mouth daily., Disp: , Rfl:  .  guaiFENesin-dextromethorphan (ROBITUSSIN DM) 100-10 MG/5ML syrup, Take 5 mLs by mouth every 4 (four) hours as needed for cough. (Patient not taking: Reported on 08/21/2019), Disp: 118 mL, Rfl: 0 .  meloxicam (MOBIC) 7.5 MG tablet, Take 1 tablet (7.5 mg total) by mouth daily., Disp: 30 tablet, Rfl: 5   PHYSICAL EXAM SECTION: 1) BP 114/75   Pulse 80   Temp (!) 97.3 F (36.3 C)   Ht 5\' 7"   (1.702 m)   Wt 184 lb (83.5 kg)   BMI 28.82 kg/m   Body mass index is 28.82 kg/m. General appearance: Well-developed well-nourished no gross deformities  2) Cardiovascular normal pulse and perfusion in the LOWER  extremities normal color without edema  3) Neurologically deep tendon reflexes are equal and normal, no sensation loss or deficits no pathologic reflexes  4) Psychological: Awake alert and oriented x3 mood and affect normal  5) Skin no lacerations or ulcerations no nodularity no palpable masses, no erythema or nodularity  6) Musculoskeletal:   RIGHT KNEE  SKIN NORMAL  Range of motion remains norma Ligaments remain stable and Mild tenderness is noted over the joint Muscle tone normal  LEFT KNEE Skin is normal. Tenderness over the medial lateral joint line and peripatellar regions Range of motion is normal No instability Muscle tone is normal   MEDICAL DECISION SECTION:  Encounter Diagnosis  Name Primary?  . Primary osteoarthritis of left knee Yes    Imaging  4 views of the knee AP lateral internal and external oblique  We do have a report but the x-ray is also available for my interpretation.  I see mild degenerative changes of the joint with this tibial spine peaking iAND the joint spaces narrowing bilaterally more lateral than medial W/ secondary bone changes include osteophyte formation  Plan:  (Rx., Inj., surg., Frx, MRI/CT, XR:2)  Kelly Buchanan has early arthritis at 18 her weight is good I advised her to keep check of that she is on a new diet which should help  She started on medication for arthritis now.  Meds ordered this encounter  Medications  . meloxicam (MOBIC) 7.5 MG tablet    Sig: Take 1 tablet (7.5 mg total) by mouth daily.    Dispense:  30 tablet    Refill:  5   FU PRN     4:06 PM Kelly Abbott, MD  08/21/2019

## 2019-08-27 ENCOUNTER — Encounter: Payer: Self-pay | Admitting: Family Medicine

## 2019-08-27 ENCOUNTER — Other Ambulatory Visit: Payer: Self-pay

## 2019-08-27 ENCOUNTER — Ambulatory Visit (INDEPENDENT_AMBULATORY_CARE_PROVIDER_SITE_OTHER): Payer: BC Managed Care – PPO | Admitting: Family Medicine

## 2019-08-27 VITALS — BP 114/85 | Ht 67.0 in | Wt 184.0 lb

## 2019-08-27 DIAGNOSIS — M1712 Unilateral primary osteoarthritis, left knee: Secondary | ICD-10-CM | POA: Diagnosis not present

## 2019-08-27 NOTE — Progress Notes (Signed)
Virtual Visit via Telephone Note   This visit type was conducted due to national recommendations for restrictions regarding the COVID-19 Pandemic (e.g. social distancing) in an effort to limit this patient's exposure and mitigate transmission in our community.  Due to her co-morbid illnesses, this patient is at least at moderate risk for complications without adequate follow up.  This format is felt to be most appropriate for this patient at this time.  The patient did not have access to video technology/had technical difficulties with video requiring transitioning to audio format only (telephone).  All issues noted in this document were discussed and addressed.  No physical exam could be performed with this format.    Evaluation Performed:  Follow-up visit  Date:  08/27/2019   ID:  TEMPRENCE BODY, DOB 1971/02/06, MRN [id number]  Patient Location: Home Provider Location: Office  Location of Patient: Home Location of Provider: Telehealth Consent was obtain for visit to be over via telehealth. I verified that I am speaking with the correct person using two identifiers.  PCP:  [name], NP   Chief Complaint: Left knee pain follow-up  History of Present Illness:    Kelly Buchanan is a 48 y.o. female with history of hyperlipidemia, murmur, new diagnosis of primary arthritis knee left, overweight.  Presents for follow-up today regarding left knee pain after being seen by orthopedist Dr. Aline Brochure. She has moderate pain in the left knee for 4 years has started getting worse in October it was associated with swelling after standing for long periods of time.  Prior treatments were IM Toradol and IM Depo-Medrol and rest. Per PE at orthopedist there was tenderness noted over the medial lateral joint line and peripatellar regions.  She does not have any instability or muscle tone changes nor range of motion issues. X-ray demonstrated mild degenerative changes of the joint with tibial  spine peaking and joint space narrowing bilaterally more lateral than medial with secondary bone changes including osteophyte formation.  She was prescribed Mobic, encouraged to make sure that she maintains her weight and follow-up as needed.  Today she reports that she does feel little bit better the Mobic is somewhat helpful but the cold is making things worse at times.  Reports that his heart when she sits for long periods of time. She reports that she also started taking a collagen supplement to help.  And has been wearing a brace but has not seen much benefit with it.  The patient does not have symptoms concerning for COVID-19 infection (fever, chills, cough, or new shortness of breath).   Past Medical, Surgical, Social History, Allergies, and Medications have been Reviewed. Past Medical History:  Diagnosis Date  . Hyperlipidemia   . Murmur    Past Surgical History:  Procedure Laterality Date  . APPENDECTOMY  1980's  . TONSILLECTOMY  1999  . TUBAL LIGATION  1997     Current Meds  Medication Sig  . acetaminophen (TYLENOL) 500 MG tablet Take 1,000 mg by mouth every 6 (six) hours as needed.  . benzonatate (TESSALON) 100 MG capsule Take 1 capsule (100 mg total) by mouth 3 (three) times daily as needed for cough.  . cetirizine (ZYRTEC) 10 MG tablet Take 10 mg by mouth daily as needed for allergies.  . Cholecalciferol (VITAMIN D) 50 MCG (2000 UT) CAPS Take 2,000 Units by mouth daily.  Marland Kitchen escitalopram (LEXAPRO) 10 MG tablet Take 1 tablet (10 mg total) by mouth daily.  . meloxicam (  MOBIC) 7.5 MG tablet Take 1 tablet (7.5 mg total) by mouth daily.  . Multiple Vitamin (MULTIVITAMIN WITH MINERALS) TABS tablet Take 1 tablet by mouth daily.     Allergies:   Patient has no known allergies.   Social History   Tobacco Use  . Smoking status: Never Smoker  . Smokeless tobacco: Never Used  Substance Use Topics  . Alcohol use: No    Alcohol/week: 0.0 standard drinks  . Drug use: No      Family Hx: The patient's family history includes Alcohol abuse in her father; Cancer in her maternal aunt and sister; Cancer (age of onset: 73) in her mother; Heart attack in her father; Heart disease (age of onset: 91) in her father; Hypertension in her father, mother, and sister; Obesity in her sister.  ROS:   Please see the history of present illness.    All other systems reviewed and are negative.   Labs/Other Tests and Data Reviewed:     Recent Labs: 03/25/2019: BUN 12; Creatinine, Ser 0.68; Hemoglobin 13.2; Platelets 322; Potassium 3.8; Sodium 138   Recent Lipid Panel Lab Results  Component Value Date/Time   CHOL 224 (H) 09/06/2016 09:28 AM   TRIG 109 09/06/2016 09:28 AM   HDL 55 09/06/2016 09:28 AM   CHOLHDL 4.1 09/06/2016 09:28 AM   LDLCALC 147 (H) 09/06/2016 09:28 AM    Wt Readings from Last 3 Encounters:  08/27/19 184 lb (83.5 kg)  08/21/19 184 lb (83.5 kg)  07/24/19 187 lb 0.6 oz (84.8 kg)     Objective:    Vital Signs:  BP 114/85 Comment: a few days ago per patient  Ht 5\' 7"  (1.702 m)   Wt 184 lb (83.5 kg)   BMI 28.82 kg/m    VITAL SIGNS:  reviewed GEN:  Alert and oriented RESPIRATORY:  No shortness of breath noted in conversation PSYCH:  Normal affect and mood good communication  ASSESSMENT & PLAN:    1. Primary osteoarthritis of left knee Encouraged to make sure she maintains her weight.  Encouraged to use Mobic and Tylenol as needed.  Encouraged to make sure that she is physically active to keep the joint healthy.  Start nutritional supplements were discussed today including collagen, turmeric, glucosamine and chondroitin. Patient acknowledged agreement and understanding of the plan.   Time:   Today, I have spent 10 minutes with the patient with telehealth technology discussing the above problems.     Medication Adjustments/Labs and Tests Ordered: Current medicines are reviewed at length with the patient today.  Concerns regarding medicines are  outlined above.   Tests Ordered: No orders of the defined types were placed in this encounter.   Medication Changes: No orders of the defined types were placed in this encounter.   Disposition:  Follow up 2 to 3 months or as needed  Signed, [name], NP  08/27/2019 9:08 AM     [facility]

## 2019-08-27 NOTE — Patient Instructions (Addendum)
  I appreciate the opportunity to provide you with care for your health and wellness. Today we discussed: knee pain   Follow up: 2-3 months   No labs or referrals today  Supplements that some say help with joint aches and pains. Collagen  Turmeric Glucosamine and or with chondroitin  Arthritis is tough, it is mostly symptom management until possible need for joint replacement.   I hope you have a wonderful, happy, safe, and healthy Holiday Season! See you in the New Year :)  Please continue to practice social distancing to keep you, your family, and our community safe.  If you must go out, please wear a mask and practice good handwashing.  It was a pleasure to see you and I look forward to continuing to work together on your health and well-being. Please do not hesitate to call the office if you need care or have questions about your care.  Have a wonderful day and week. With Gratitude, Cherly Beach, DNP, AGNP-BC

## 2019-09-16 ENCOUNTER — Encounter: Payer: Self-pay | Admitting: Family Medicine

## 2019-09-16 ENCOUNTER — Other Ambulatory Visit: Payer: Self-pay

## 2019-09-16 ENCOUNTER — Ambulatory Visit (INDEPENDENT_AMBULATORY_CARE_PROVIDER_SITE_OTHER): Payer: BC Managed Care – PPO | Admitting: Family Medicine

## 2019-09-16 VITALS — BP 114/85 | HR 80 | Resp 15 | Ht 67.0 in | Wt 184.0 lb

## 2019-09-16 DIAGNOSIS — E785 Hyperlipidemia, unspecified: Secondary | ICD-10-CM

## 2019-09-16 DIAGNOSIS — R1013 Epigastric pain: Secondary | ICD-10-CM

## 2019-09-16 DIAGNOSIS — R1310 Dysphagia, unspecified: Secondary | ICD-10-CM

## 2019-09-16 DIAGNOSIS — E559 Vitamin D deficiency, unspecified: Secondary | ICD-10-CM

## 2019-09-16 DIAGNOSIS — R1011 Right upper quadrant pain: Secondary | ICD-10-CM | POA: Diagnosis not present

## 2019-09-16 DIAGNOSIS — R5383 Other fatigue: Secondary | ICD-10-CM | POA: Diagnosis not present

## 2019-09-16 DIAGNOSIS — I10 Essential (primary) hypertension: Secondary | ICD-10-CM

## 2019-09-16 MED ORDER — PANTOPRAZOLE SODIUM 20 MG PO TBEC: 20.0000 mg | DELAYED_RELEASE_TABLET | Freq: Every day | ORAL | 0 refills | Status: DC

## 2019-09-16 NOTE — Patient Instructions (Addendum)
F/U with Jarrett Soho in 2 to 3 weeks, phone visit / video, re evaluate abdominal pain and f/u labs  Work excuse for 12/14 and 12/15,, 2020   Fasting lipid, TSH, vit D and breath test tomorrow morning  Appointment for covi 19 test to be scheduled for you to have this done tomorrow   Additional lab ordered by me is H pylori, due to c/o burning abdominal pain and intermittent episodes of reflux  New medication for presumed reflux protoxix, 20 mg once daily is prescribed for 1 month only, start only after you have had labs drawn tomorrow, in particular the breath test  Thanks for choosing [facility], we consider it a privelige to serve you.

## 2019-09-16 NOTE — Progress Notes (Signed)
Virtual Visit via Telephone Note  I connected with LEIGHLA MIHELICH on 09/16/19 at  3:20 PM EST by telephone and verified that I am speaking with the correct person using two identifiers.  Location: Patient: home Provider: office   I discussed the limitations, risks, security and privacy concerns of performing an evaluation and management service by telephone and the availability of in person appointments. I also discussed with the patient that there may be a patient responsible charge related to this service. The patient expressed understanding and agreed to proceed.   History of Present Illness:  Felt very weak yesterday , late last night started having burning stomach pains intermittent waxing and waining, alka selzer and tylenol offered little relief. 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 Alert and oriented x 3 No signs of respiratory distress during speech    Assessment and Plan: Abdominal pain Epigastric and RUQ discomfort, burning and bloating, symptoms suggestive of reflux, obtain H pylori test and trial of PPI short term with f/u by PCP in 1 month Encouraged to reduce / eliminate caffeine  Fatigue Reports severe fatigue , making it difficult for her to work , work excuse  x1 day, outstanding labs to be done and these will be reviewed specifically to assess/ eliminate anemia, diabetes and thyroid disease PCP to f/u on this sympptom    Follow Up Instructions:    I discussed the assessment and treatment plan with the patient. The patient was provided an opportunity to ask questions and all were answered. The patient agreed with the plan and  demonstrated an understanding of the instructions.   The patient was advised to call back or seek an in-person evaluation if the symptoms worsen or if the condition fails to improve as anticipated.  I provided 15 minutes of non-face-to-face time during this encounter.   Tula Nakayama, MD

## 2019-09-17 DIAGNOSIS — E559 Vitamin D deficiency, unspecified: Secondary | ICD-10-CM | POA: Diagnosis not present

## 2019-09-17 DIAGNOSIS — I10 Essential (primary) hypertension: Secondary | ICD-10-CM | POA: Diagnosis not present

## 2019-09-17 DIAGNOSIS — R1310 Dysphagia, unspecified: Secondary | ICD-10-CM | POA: Diagnosis not present

## 2019-09-18 LAB — VITAMIN D 25 HYDROXY (VIT D DEFICIENCY, FRACTURES): Vit D, 25-Hydroxy: 19 ng/mL — ABNORMAL LOW (ref 30–100)

## 2019-09-18 LAB — LIPID PANEL
Cholesterol: 253 mg/dL — ABNORMAL HIGH (ref <200)
HDL: 65 mg/dL (ref >=50)
LDL Cholesterol (Calc): 169 mg/dL (calc) — ABNORMAL HIGH
Non-HDL Cholesterol (Calc): 188 mg/dL (calc) — ABNORMAL HIGH (ref <130)
Total CHOL/HDL Ratio: 3.9 (calc) (ref <5.0)
Triglycerides: 82 mg/dL (ref <150)

## 2019-09-18 LAB — TSH: TSH: 1.4 mIU/L

## 2019-09-18 LAB — H. PYLORI BREATH TEST: H. pylori Breath Test: NOT DETECTED

## 2019-09-19 ENCOUNTER — Other Ambulatory Visit: Payer: Self-pay

## 2019-09-19 ENCOUNTER — Ambulatory Visit: Payer: BC Managed Care – PPO | Attending: Internal Medicine

## 2019-09-19 DIAGNOSIS — Z20822 Contact with and (suspected) exposure to covid-19: Secondary | ICD-10-CM

## 2019-09-19 DIAGNOSIS — Z20828 Contact with and (suspected) exposure to other viral communicable diseases: Secondary | ICD-10-CM | POA: Diagnosis not present

## 2019-09-20 LAB — NOVEL CORONAVIRUS, NAA: SARS-CoV-2, NAA: NOT DETECTED

## 2019-09-22 ENCOUNTER — Encounter: Payer: Self-pay | Admitting: Family Medicine

## 2019-09-22 ENCOUNTER — Other Ambulatory Visit: Payer: Self-pay | Admitting: Family Medicine

## 2019-09-22 MED ORDER — ERGOCALCIFEROL 1.25 MG (50000 UT) PO CAPS: 50000.0000 [IU] | ORAL_CAPSULE | ORAL | 0 refills | Status: DC

## 2019-09-23 ENCOUNTER — Encounter: Payer: Self-pay | Admitting: Family Medicine

## 2019-09-23 DIAGNOSIS — K219 Gastro-esophageal reflux disease without esophagitis: Secondary | ICD-10-CM | POA: Insufficient documentation

## 2019-09-23 DIAGNOSIS — R5383 Other fatigue: Secondary | ICD-10-CM | POA: Insufficient documentation

## 2019-09-23 NOTE — Assessment & Plan Note (Signed)
Epigastric and RUQ discomfort, burning and bloating, symptoms suggestive of reflux, obtain H pylori test and trial of PPI short term with f/u by PCP in 1 month Encouraged to reduce / eliminate caffeine

## 2019-09-23 NOTE — Assessment & Plan Note (Signed)
Reports severe fatigue , making it difficult for her to work , work excuse  x1 day, outstanding labs to be done and these will be reviewed specifically to assess/ eliminate anemia, diabetes and thyroid disease PCP to f/u on this sympptom

## 2019-09-24 ENCOUNTER — Encounter: Payer: Self-pay | Admitting: Family Medicine

## 2019-10-09 ENCOUNTER — Ambulatory Visit: Payer: BC Managed Care – PPO | Admitting: Family Medicine

## 2019-10-11 ENCOUNTER — Ambulatory Visit (INDEPENDENT_AMBULATORY_CARE_PROVIDER_SITE_OTHER): Payer: Self-pay | Admitting: Family Medicine

## 2019-10-11 ENCOUNTER — Other Ambulatory Visit: Payer: Self-pay

## 2019-10-11 ENCOUNTER — Encounter: Payer: Self-pay | Admitting: Family Medicine

## 2019-10-11 ENCOUNTER — Ambulatory Visit: Payer: BC Managed Care – PPO | Admitting: Family Medicine

## 2019-10-11 DIAGNOSIS — R5383 Other fatigue: Secondary | ICD-10-CM

## 2019-10-11 DIAGNOSIS — E559 Vitamin D deficiency, unspecified: Secondary | ICD-10-CM

## 2019-10-11 DIAGNOSIS — K219 Gastro-esophageal reflux disease without esophagitis: Secondary | ICD-10-CM

## 2019-10-11 DIAGNOSIS — E785 Hyperlipidemia, unspecified: Secondary | ICD-10-CM

## 2019-10-11 MED ORDER — PANTOPRAZOLE SODIUM 20 MG PO TBEC: 20.0000 mg | DELAYED_RELEASE_TABLET | Freq: Every day | ORAL | 0 refills | Status: DC

## 2019-10-11 NOTE — Progress Notes (Signed)
Virtual Visit via Telephone Note   This visit type was conducted due to national recommendations for restrictions regarding the COVID-19 Pandemic (e.g. social distancing) in an effort to limit this patient's exposure and mitigate transmission in our community.  Due to her co-morbid illnesses, this patient is at least at moderate risk for complications without adequate follow up.  This format is felt to be most appropriate for this patient at this time.  The patient did not have access to video technology/had technical difficulties with video requiring transitioning to audio format only (telephone).  All issues noted in this document were discussed and addressed.  No physical exam could be performed with this format.    Evaluation Performed:  Follow-up visit  Date:  10/11/2019   ID:  Kelly Buchanan, DOB 01/27/1971, MRN [id number]  Patient Location: Home Provider Location: Office  Location of Patient: Home Location of Provider: Telehealth Consent was obtain for visit to be over via telehealth. I verified that I am speaking with the correct person using two identifiers.  PCP:  [name], NP   Chief Complaint: Follow-up on stomach discomfort/GERD fatigue  History of Present Illness:    Kelly Buchanan is a 49 y.o. female with history of hyperlipidemia, murmur, GERD, primary osteoarthritis of knee, obesity, vitamin D deficiency among others.  Presents today for follow-up after seeing Dr. Moshe Cipro a month ago where she was diagnosed with probable GERD.  Reports that she has been taking her Protonix and that seems to be helping she is also avoid fried foods.  And she also started her vitamin D and can tell a difference in her fatigue level.  Does work shift work so that makes a little bit more difficult.  Overall she reports improvement.  Only question she has today is that she wants to know what she should be doing for her diet to help with cholesterol level.  Currently does not want to be  going on any medications if she can help very receptive to information.   The patient does not have symptoms concerning for COVID-19 infection (fever, chills, cough, or new shortness of breath).   Past Medical, Surgical, Social History, Allergies, and Medications have been Reviewed.  Past Medical History:  Diagnosis Date  . Hyperlipidemia   . Murmur    Past Surgical History:  Procedure Laterality Date  . APPENDECTOMY  1980's  . TONSILLECTOMY  1999  . TUBAL LIGATION  1997     No outpatient medications have been marked as taking for the 10/11/19 encounter (Appointment) with [name], NP.     Allergies:   Patient has no known allergies.   ROS:   Please see the history of present illness.    All other systems reviewed and are negative.   Labs/Other Tests and Data Reviewed:    Recent Labs: 03/25/2019: BUN 12; Creatinine, Ser 0.68; Hemoglobin 13.2; Platelets 322; Potassium 3.8; Sodium 138 09/17/2019: TSH 1.40   Recent Lipid Panel Lab Results  Component Value Date/Time   CHOL 253 (H) 09/17/2019 09:59 AM   TRIG 82 09/17/2019 09:59 AM   HDL 65 09/17/2019 09:59 AM   CHOLHDL 3.9 09/17/2019 09:59 AM   LDLCALC 169 (H) 09/17/2019 09:59 AM    Wt Readings from Last 3 Encounters:  09/16/19 184 lb (83.5 kg)  08/27/19 184 lb (83.5 kg)  08/21/19 184 lb (83.5 kg)     Objective:    Vital Signs:  There were no vitals taken for this  visit.   GEN:  alert and oriented  RESPIRATORY:  no shortness of breath in conversation  PSYCH:  normal affect and mood   ASSESSMENT & PLAN:    1. Gastroesophageal reflux disease, unspecified whether esophagitis present  - pantoprazole (PROTONIX) 20 MG tablet; Take 1 tablet (20 mg total) by mouth daily.  Dispense: 30 tablet; Refill: 0  2. Vitamin D deficiency   3. Fatigue, unspecified type   4. Hyperlipidemia, unspecified hyperlipidemia type  Please see problem list for A&P  Time:   Today, I have spent 10 minutes with the patient  with telehealth technology discussing the above problems.     Medication Adjustments/Labs and Tests Ordered: Current medicines are reviewed at length with the patient today.  Concerns regarding medicines are outlined above.   Tests Ordered: No orders of the defined types were placed in this encounter.   Medication Changes: No orders of the defined types were placed in this encounter.   Disposition:  Follow up First week of March  Signed, Scottie Stanish M Tayvian Holycross, NP  10/11/2019 11:34 AM     [place]

## 2019-10-11 NOTE — Assessment & Plan Note (Signed)
Discussion on diet control for hyperlipidemia in addition to exercise which is very difficult for her secondary to having arthritis already in her knees.  Reviewed several food options.  Advised for follow-up in the beginning of March to reassess labs and possible need for medications she would like to refrain from medications for as long as possible.

## 2019-10-11 NOTE — Assessment & Plan Note (Signed)
Reports that she can already tell a difference with taking the vitamin D 50 has improved.  Continue vitamin D at this time.  Advised that exercise is very beneficial in getting sunshine 20 minutes a day as well.

## 2019-10-11 NOTE — Patient Instructions (Signed)
Happy New Year! May you have a year filled with hope, love, happiness and laughter.  I appreciate the opportunity to provide you with care for your health and wellness. Today we discussed: Cholesterol, GERD, vitamin D  Follow up: First week of March  Labs placed today for you to get that day if fasting or the week before make sure you are fasting   No referrals today  I have attached some diet plans for you to help with your cholesterol levels try to walk and exercise as your knee will allow.  Please continue to practice social distancing to keep you, your family, and our community safe.  If you must go out, please wear a mask and practice good handwashing.  It was a pleasure to see you and I look forward to continuing to work together on your health and well-being. Please do not hesitate to call the office if you need care or have questions about your care.  Have a wonderful day and week. With Gratitude, Cherly Beach, DNP, AGNP-BC   Fat and Cholesterol Restricted Eating Plan Getting too much fat and cholesterol in your diet may cause health problems. Choosing the right foods helps keep your fat and cholesterol at normal levels. This can keep you from getting certain diseases. Your doctor may recommend an eating plan that includes:  Total fat: ______% or less of total calories a day.  Saturated fat: ______% or less of total calories a day.  Cholesterol: less than _________mg a day.  Fiber: ______g a day. What are tips for following this plan? Meal planning  At meals, divide your plate into four equal parts: ? Fill one-half of your plate with vegetables and green salads. ? Fill one-fourth of your plate with whole grains. ? Fill one-fourth of your plate with low-fat (lean) protein foods.  Eat fish that is high in omega-3 fats at least two times a week. This includes mackerel, tuna, sardines, and salmon.  Eat foods that are high in fiber, such as whole grains, beans, apples,  broccoli, carrots, peas, and barley. General tips   Work with your doctor to lose weight if you need to.  Avoid: ? Foods with added sugar. ? Fried foods. ? Foods with partially hydrogenated oils.  Limit alcohol intake to no more than 1 drink a day for nonpregnant women and 2 drinks a day for men. One drink equals 12 oz of beer, 5 oz of wine, or 1 oz of hard liquor. Reading food labels  Check food labels for: ? Trans fats. ? Partially hydrogenated oils. ? Saturated fat (g) in each serving. ? Cholesterol (mg) in each serving. ? Fiber (g) in each serving.  Choose foods with healthy fats, such as: ? Monounsaturated fats. ? Polyunsaturated fats. ? Omega-3 fats.  Choose grain products that have whole grains. Look for the word "whole" as the first word in the ingredient list. Cooking  Cook foods using low-fat methods. These include baking, boiling, grilling, and broiling.  Eat more home-cooked foods. Eat at restaurants and buffets less often.  Avoid cooking using saturated fats, such as butter, cream, palm oil, palm kernel oil, and coconut oil. Recommended foods  Fruits  All fresh, canned (in natural juice), or frozen fruits. Vegetables  Fresh or frozen vegetables (raw, steamed, roasted, or grilled). Green salads. Grains  Whole grains, such as whole wheat or whole grain breads, crackers, cereals, and pasta. Unsweetened oatmeal, bulgur, barley, quinoa, or brown rice. Corn or whole wheat flour tortillas. Meats and  other protein foods  Ground beef (85% or leaner), grass-fed beef, or beef trimmed of fat. Skinless chicken or [place]. Ground chicken or [place]. Pork trimmed of fat. All fish and seafood. Egg whites. Dried beans, peas, or lentils. Unsalted nuts or seeds. Unsalted canned beans. Nut butters without added sugar or oil. Dairy  Low-fat or nonfat dairy products, such as skim or 1% milk, 2% or reduced-fat cheeses, low-fat and fat-free ricotta or cottage cheese, or plain  low-fat and nonfat yogurt. Fats and oils  Tub margarine without trans fats. Light or reduced-fat mayonnaise and salad dressings. Avocado. Olive, canola, sesame, or safflower oils. The items listed above may not be a complete list of foods and beverages you can eat. Contact a dietitian for more information. Foods to avoid Fruits  Canned fruit in heavy syrup. Fruit in cream or butter sauce. Fried fruit. Vegetables  Vegetables cooked in cheese, cream, or butter sauce. Fried vegetables. Grains  White bread. White pasta. White rice. Cornbread. Bagels, pastries, and croissants. Crackers and snack foods that contain trans fat and hydrogenated oils. Meats and other protein foods  Fatty cuts of meat. Ribs, chicken wings, bacon, sausage, bologna, salami, chitterlings, fatback, hot dogs, bratwurst, and packaged lunch meats. Liver and organ meats. Whole eggs and egg yolks. Chicken and [place] with skin. Fried meat. Dairy  Whole or 2% milk, cream, half-and-half, and cream cheese. Whole milk cheeses. Whole-fat or sweetened yogurt. Full-fat cheeses. Nondairy creamers and whipped toppings. Processed cheese, cheese spreads, and cheese curds. Beverages  Alcohol. Sugar-sweetened drinks such as sodas, lemonade, and fruit drinks. Fats and oils  Butter, stick margarine, lard, shortening, ghee, or bacon fat. Coconut, palm kernel, and palm oils. Sweets and desserts  Corn syrup, sugars, honey, and molasses. Candy. Jam and jelly. Syrup. Sweetened cereals. Cookies, pies, cakes, donuts, muffins, and ice cream. The items listed above may not be a complete list of foods and beverages you should avoid. Contact a dietitian for more information. Summary  Choosing the right foods helps keep your fat and cholesterol at normal levels. This can keep you from getting certain diseases.  At meals, fill one-half of your plate with vegetables and green salads.  Eat high-fiber foods, like whole grains, beans, apples,  carrots, peas, and barley.  Limit added sugar, saturated fats, alcohol, and fried foods. This information is not intended to replace advice given to you by your health care provider. Make sure you discuss any questions you have with your health care provider. Document Revised: 05/23/2018 Document Reviewed: 06/06/2017 Elsevier Patient Education  [place].   Preventing High Cholesterol Cholesterol is a white, waxy substance similar to fat that the human body needs to help build cells. The liver makes all the cholesterol that a person's body needs. Having high cholesterol (hypercholesterolemia) increases a person's risk for heart disease and stroke. Extra (excess) cholesterol comes from the food the person eats. High cholesterol can often be prevented with diet and lifestyle changes. If you already have high cholesterol, you can control it with diet and lifestyle changes and with medicine. How can high cholesterol affect me? If you have high cholesterol, deposits (plaques) may build up on the walls of your arteries. The arteries are the blood vessels that carry blood away from your heart. Plaques make the arteries narrower and stiffer. This can limit or block blood flow and cause blood clots to form. Blood clots:  Are tiny balls of cells that form in your blood.  Can move to the heart or  brain, causing a heart attack or stroke. Plaques in arteries greatly increase your risk for heart attack and stroke.Making diet and lifestyle changes can reduce your risk for these conditions that may threaten your life. What can increase my risk? This condition is more likely to develop in people who:  Eat foods that are high in saturated fat or cholesterol. Saturated fat is mostly found in: ? Foods that contain animal fat, such as red meat and some dairy products. ? Certain fatty foods made from plants, such as tropical oils.  Are overweight.  Are not getting enough exercise.  Have a family  history of high cholesterol. What actions can I take to prevent this? Nutrition   Eat less saturated fat.  Avoid trans fats (partially hydrogenated oils). These are often found in margarine and in some baked goods, fried foods, and snacks bought in packages.  Avoid precooked or cured meat, such as sausages or meat loaves.  Avoid foods and drinks that have added sugars.  Eat more fruits, vegetables, and whole grains.  Choose healthy sources of protein, such as fish, poultry, lean cuts of red meat, beans, peas, lentils, and nuts.  Choose healthy sources of fat, such as: ? Nuts. ? Vegetable oils, especially olive oil. ? Fish that have healthy fats (omega-3 fatty acids), such as mackerel or salmon. The items listed above may not be a complete list of recommended foods and beverages. Contact a dietitian for more information. Lifestyle  Lose weight if you are overweight. Losing 5-10 lb (2.3-4.5 kg) can help prevent or control high cholesterol. It can also lower your risk for diabetes and high blood pressure. Ask your health care provider to help you with a diet and exercise plan to lose weight safely.  Do not use any products that contain nicotine or tobacco, such as cigarettes, e-cigarettes, and chewing tobacco. If you need help quitting, ask your health care provider.  Limit your alcohol intake. ? Do not drink alcohol if:  Your health care provider tells you not to drink.  You are pregnant, may be pregnant, or are planning to become pregnant. ? If you drink alcohol:  Limit how much you use to:  0-1 drink a day for women.  0-2 drinks a day for men.  Be aware of how much alcohol is in your drink. In the [place] one drink equals one 12 oz bottle of beer (355 mL), one 5 oz glass of wine (148 mL), or one 1 oz glass of hard liquor (44 mL). Activity   Get enough exercise. Each week, do at least 150 minutes of exercise that takes a medium level of effort (moderate-intensity  exercise). ? This is exercise that:  Makes your heart beat faster and makes you breathe harder than usual.  Allows you to still be able to talk. ? You could exercise in short sessions several times a day or longer sessions a few times a week. For example, on 5 days each week, you could walk fast or ride your bike 3 times a day for 10 minutes each time.  Do exercises as told by your health care provider. Medicines  In addition to diet and lifestyle changes, your health care provider may recommend medicines to help lower cholesterol. This may be a medicine to lower the amount of cholesterol your liver makes. You may need medicine if: ? Diet and lifestyle changes do not lower your cholesterol enough. ? You have high cholesterol and other risk factors for heart disease or stroke.  Take over-the-counter and prescription medicines only as told by your health care provider. General information  Manage your risk factors for high cholesterol. Talk with your health care provider about all your risk factors and how to lower your risk.  Manage other conditions that you have, such as diabetes or high blood pressure (hypertension).  Have blood tests to check your cholesterol levels at regular points in time as told by your health care provider.  Keep all follow-up visits as told by your health care provider. This is important. Where to find more information  American Heart Association: www.heart.org  National Heart, Lung, and Blood Institute: https://wilson-[place].com/ Summary  High cholesterol increases your risk for heart disease and stroke. By keeping your cholesterol level low, you can reduce your risk for these conditions.  High cholesterol can often be prevented with diet and lifestyle changes.  Work with your health care provider to manage your risk factors, and have your blood tested regularly. This information is not intended to replace advice given to you by your health care provider. Make  sure you discuss any questions you have with your health care provider. Document Revised: 01/11/2019 Document Reviewed: 05/28/2016 Elsevier Patient Education  2020 [employer].

## 2019-10-11 NOTE — Assessment & Plan Note (Signed)
Improved H. pylori negative.  Trial of PPIs is doing really well.  We will continue this for 1 more month before weaning to Pepcid.  She has reduced caffeine in her diet and reduced fried foods and reports that this seems to be helping as well.  Education on stepwise approach to wean down so that she does not get flareup of acid reflux coming off of Protonix

## 2019-10-11 NOTE — Assessment & Plan Note (Signed)
Much improved.  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  She can tell a difference since starting vitamin D.

## 2019-10-15 ENCOUNTER — Ambulatory Visit: Payer: Self-pay | Admitting: Family Medicine

## 2019-11-12 ENCOUNTER — Ambulatory Visit: Payer: BC Managed Care – PPO | Admitting: Family Medicine

## 2019-12-03 ENCOUNTER — Other Ambulatory Visit: Payer: Self-pay

## 2019-12-03 ENCOUNTER — Ambulatory Visit (INDEPENDENT_AMBULATORY_CARE_PROVIDER_SITE_OTHER): Payer: BC Managed Care – PPO | Admitting: Family Medicine

## 2019-12-03 ENCOUNTER — Ambulatory Visit (HOSPITAL_COMMUNITY)
Admission: RE | Admit: 2019-12-03 | Discharge: 2019-12-03 | Disposition: A | Discharge status: Home / Self Care | Payer: BC Managed Care – PPO | Source: Ambulatory Visit | Attending: Family Medicine | Admitting: Family Medicine

## 2019-12-03 ENCOUNTER — Encounter: Payer: Self-pay | Admitting: Family Medicine

## 2019-12-03 VITALS — BP 130/78 | HR 86 | Temp 97.0°F | Resp 15 | Ht 67.0 in | Wt 185.1 lb

## 2019-12-03 DIAGNOSIS — M545 Low back pain: Secondary | ICD-10-CM | POA: Diagnosis not present

## 2019-12-03 DIAGNOSIS — M5441 Lumbago with sciatica, right side: Secondary | ICD-10-CM

## 2019-12-03 DIAGNOSIS — E559 Vitamin D deficiency, unspecified: Secondary | ICD-10-CM | POA: Diagnosis not present

## 2019-12-03 DIAGNOSIS — G8929 Other chronic pain: Secondary | ICD-10-CM | POA: Diagnosis not present

## 2019-12-03 DIAGNOSIS — I10 Essential (primary) hypertension: Secondary | ICD-10-CM | POA: Diagnosis not present

## 2019-12-03 DIAGNOSIS — E785 Hyperlipidemia, unspecified: Secondary | ICD-10-CM

## 2019-12-03 MED ORDER — KETOROLAC TROMETHAMINE 60 MG/2ML IM SOLN
60.0000 mg | Freq: Once | INTRAMUSCULAR | Status: AC
  Administered 2019-12-03: 11:00:00 60 mg via INTRAMUSCULAR

## 2019-12-03 MED ORDER — METHYLPREDNISOLONE ACETATE 80 MG/ML IJ SUSP
80.0000 mg | Freq: Once | INTRAMUSCULAR | Status: AC
  Administered 2019-12-03: 80 mg via INTRAMUSCULAR

## 2019-12-03 NOTE — Patient Instructions (Addendum)
I appreciate the opportunity to provide you with care for your health and wellness. Today we discussed: back pain and cholesterol  Follow up:  4 week phone appt or office if needed  Labs today  Xray today  Injections today to help with pain   Please continue to practice social distancing to keep you, your family, and our community safe.  If you must go out, please wear a mask and practice good handwashing.  It was a pleasure to see you and I look forward to continuing to work together on your health and well-being. Please do not hesitate to call the office if you need care or have questions about your care.  Have a wonderful day and week. With Gratitude, Cherly Beach, DNP, AGNP-BC

## 2019-12-03 NOTE — Assessment & Plan Note (Signed)
Updated labs ordered today continue low-fat diet she is done really well with this per her.  Hopefully labs will demonstrate better control.

## 2019-12-03 NOTE — Assessment & Plan Note (Signed)
Updated labs today ?

## 2019-12-03 NOTE — Assessment & Plan Note (Signed)
Kelly Buchanan is encouraged to maintain a well balanced diet that is low in salt. Controlled, continue current medication regimen. Refills provided   No refills needed.  Additionally, she is also reminded that exercise is beneficial for heart health and control of  Blood pressure. 30-60 minutes daily is recommended-walking was suggested.

## 2019-12-03 NOTE — Assessment & Plan Note (Signed)
Ongoing chronic back pain with some sciatica.  Had changes of osteoarthritis in the knee will be getting back x-rays to make sure that she does not have any degenerative changes that could be addressed.  As she continues to have discomfort and hard time moving especially in the morning or after sitting for long periods of time.  Ortho referral made as well.  Appreciate collaboration in her care.

## 2019-12-03 NOTE — Progress Notes (Signed)
Subjective:  Patient ID: Kelly Buchanan, female    DOB: 1971/06/24  Age: 49 y.o. MRN: [id number]  CC:  Chief Complaint  Patient presents with  . Back Pain    patient says its coming from her duty belt  . Hyperlipidemia      HPI  HPI  Kelly Buchanan is a 49 year old female patient of mine.  He presents today for follow-up on hyperlipidemia needs to get updated labs that she was doing a 64-month review of diet stopped eating red meat and a lot of processed foods.   She is fasting today and ready for lab work.  Of note she reports that she continues to have discomfort and back pain it varies day-to-day.  But she is starting to notice that almost every day when she tries to get up from bed or sitting position that she is having trouble moving around and walking.  She says it eases up as the day goes on.  She recently was diagnosed with osteoarthritis of the knee.  Questionable if she has any changes in her spine.  Is open to getting referral to Ortho and an x-ray.  She denies having any cauda equina symptoms.  Reports that she still able to do her daily activities she does have a lot of discomfort.  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  She is thinking about possibly changing to parole but she would still have to carry some form of the belt.   She reports her pain today is a 4 5 out of 10.  Just sitting.  But when she gets up to move around it jumps up to a 6 7 at times.  Is open to getting injections today to help get pain under control because she is quite miserable per her.  Today patient denies signs and symptoms of COVID 19 infection including fever, chills, cough, shortness of breath, and headache. Past Medical, Surgical, Social History, Allergies, and Medications have been Reviewed.   Past Medical History:  Diagnosis Date  . Hyperhidrosis of axilla 09/06/2016  . Hyperlipidemia   . Murmur     Current Meds  Medication  Sig  . acetaminophen (TYLENOL) 500 MG tablet Take 1,000 mg by mouth every 6 (six) hours as needed.  . benzonatate (TESSALON) 100 MG capsule Take 1 capsule (100 mg total) by mouth 3 (three) times daily as needed for cough.  . cetirizine (ZYRTEC) 10 MG tablet Take 10 mg by mouth daily as needed for allergies.  . Cholecalciferol (VITAMIN D) 50 MCG (2000 UT) CAPS Take 2,000 Units by mouth daily.  . ergocalciferol (VITAMIN D2) 1.25 MG (50000 UT) capsule Take 1 capsule (50,000 Units total) by mouth once a week. One capsule once weekly  . escitalopram (LEXAPRO) 10 MG tablet Take 1 tablet (10 mg total) by mouth daily.  Marland Kitchen guaiFENesin-dextromethorphan (ROBITUSSIN DM) 100-10 MG/5ML syrup Take 5 mLs by mouth every 4 (four) hours as needed for cough.  . meloxicam (MOBIC) 7.5 MG tablet Take 1 tablet (7.5 mg total) by mouth daily.  . Multiple Vitamin (MULTIVITAMIN WITH MINERALS) TABS tablet Take 1 tablet by mouth daily.  . pantoprazole (PROTONIX) 20 MG tablet Take 1 tablet (20 mg total) by mouth daily.    ROS:  Review of Systems  Constitutional: Negative.   HENT: Negative.   Eyes: Negative.   Respiratory: Negative.   Cardiovascular: Negative.   Gastrointestinal: Negative.   Genitourinary:  Negative.   Musculoskeletal: Positive for back pain.  Skin: Negative.   Neurological: Negative.   Endo/Heme/Allergies: Negative.   Psychiatric/Behavioral: Negative.   All other systems reviewed and are negative.    Objective:   Today's Vitals: BP 130/78   Pulse 86   Temp (!) 97 F (36.1 C) (Temporal)   Resp 15   Ht 5\' 7"  (1.702 m)   Wt 185 lb 1.9 oz (84 kg)   LMP 11/19/2019 Comment: tubaligation  SpO2 97%   BMI 28.99 kg/m  Vitals with BMI 12/03/2019 09/16/2019 08/27/2019  Height 5\' 7"  5\' 7"  5\' 7"   Weight 185 lbs 2 oz 184 lbs 184 lbs  BMI 28.99 [id number]  Systolic [id number]  Diastolic 78 85 85  Pulse 86 80 -     Physical Exam Vitals and nursing note reviewed.  Constitutional:       Appearance: Normal appearance. She is well-developed, well-groomed and overweight.  HENT:     Head: Normocephalic and atraumatic.     Right Ear: External ear normal.     Left Ear: External ear normal.     Mouth/Throat:     Comments: Mask in place  Eyes:     General:        Right eye: No discharge.        Left eye: No discharge.     Conjunctiva/sclera: Conjunctivae normal.  Cardiovascular:     Rate and Rhythm: Normal rate and regular rhythm.     Pulses: Normal pulses.     Heart sounds: Normal heart sounds.  Pulmonary:     Effort: Pulmonary effort is normal.     Breath sounds: Normal breath sounds.  Musculoskeletal:     Cervical back: Normal range of motion and neck supple.  Skin:    General: Skin is warm.  Neurological:     General: No focal deficit present.     Mental Status: She is alert and oriented to person, place, and time.  Psychiatric:        Mood and Affect: Mood normal.        Behavior: Behavior normal.        Thought Content: Thought content normal.        Judgment: Judgment normal.          Assessment   1. Essential hypertension   2. Hyperlipidemia, unspecified hyperlipidemia type   3. Chronic bilateral low back pain with right-sided sciatica   4. Vitamin D deficiency     Tests ordered Orders Placed This Encounter  Procedures  . DG Lumbar Spine Complete  . CBC  . BASIC METABOLIC PANEL WITH GFR  . Lipid panel  . VITAMIN D 25 Hydroxy (Vit-D Deficiency, Fractures)  . Ambulatory referral to Orthopedic Surgery     Plan: Please see assessment and plan per problem list above.   Meds ordered this encounter  Medications  . methylPREDNISolone acetate (DEPO-MEDROL) injection 80 mg  . ketorolac (TORADOL) injection 60 mg    Patient to follow-up in 12/31/2019   [name], NP

## 2019-12-04 ENCOUNTER — Other Ambulatory Visit: Payer: Self-pay | Admitting: Family Medicine

## 2019-12-04 DIAGNOSIS — E785 Hyperlipidemia, unspecified: Secondary | ICD-10-CM

## 2019-12-04 LAB — BASIC METABOLIC PANEL WITH GFR
BUN: 10 mg/dL (ref 7–25)
CO2: 30 mmol/L (ref 20–32)
Calcium: 9.5 mg/dL (ref 8.6–10.2)
Chloride: 100 mmol/L (ref 98–110)
Creat: 0.77 mg/dL (ref 0.50–1.10)
GFR, Est African American: 107 mL/min/{1.73_m2} (ref >=60)
GFR, Est Non African American: 92 mL/min/{1.73_m2} (ref >=60)
Glucose, Bld: 84 mg/dL (ref 65–99)
Potassium: 4 mmol/L (ref 3.5–5.3)
Sodium: 137 mmol/L (ref 135–146)

## 2019-12-04 LAB — LIPID PANEL
Cholesterol: 236 mg/dL — ABNORMAL HIGH (ref <200)
HDL: 52 mg/dL (ref >=50)
LDL Cholesterol (Calc): 163 mg/dL (calc) — ABNORMAL HIGH
Non-HDL Cholesterol (Calc): 184 mg/dL (calc) — ABNORMAL HIGH (ref <130)
Total CHOL/HDL Ratio: 4.5 (calc) (ref <5.0)
Triglycerides: 99 mg/dL (ref <150)

## 2019-12-04 LAB — CBC
HCT: 37.2 % (ref 35.0–45.0)
Hemoglobin: 12.9 g/dL (ref 11.7–15.5)
MCH: 30.1 pg (ref 27.0–33.0)
MCHC: 34.7 g/dL (ref 32.0–36.0)
MCV: 86.9 fL (ref 80.0–100.0)
MPV: 9.1 fL (ref 7.5–12.5)
Platelets: 344 10*3/uL (ref 140–400)
RBC: 4.28 10*6/uL (ref 3.80–5.10)
RDW: 13 % (ref 11.0–15.0)
WBC: 5.5 10*3/uL (ref 3.8–10.8)

## 2019-12-04 LAB — VITAMIN D 25 HYDROXY (VIT D DEFICIENCY, FRACTURES): Vit D, 25-Hydroxy: 44 ng/mL (ref 30–100)

## 2019-12-04 MED ORDER — ATORVASTATIN CALCIUM 10 MG PO TABS: 10.0000 mg | ORAL_TABLET | Freq: Every day | ORAL | 1 refills | Status: DC

## 2019-12-10 IMAGING — DX DG KNEE COMPLETE 4+V*L*
4 series · 4 of 4 positions shown · non-contrast
Comparison: None.

CLINICAL DATA: Left knee pain/swelling

EXAM:
LEFT KNEE - COMPLETE 4+ VIEW

[knee ap (1 of 3)]
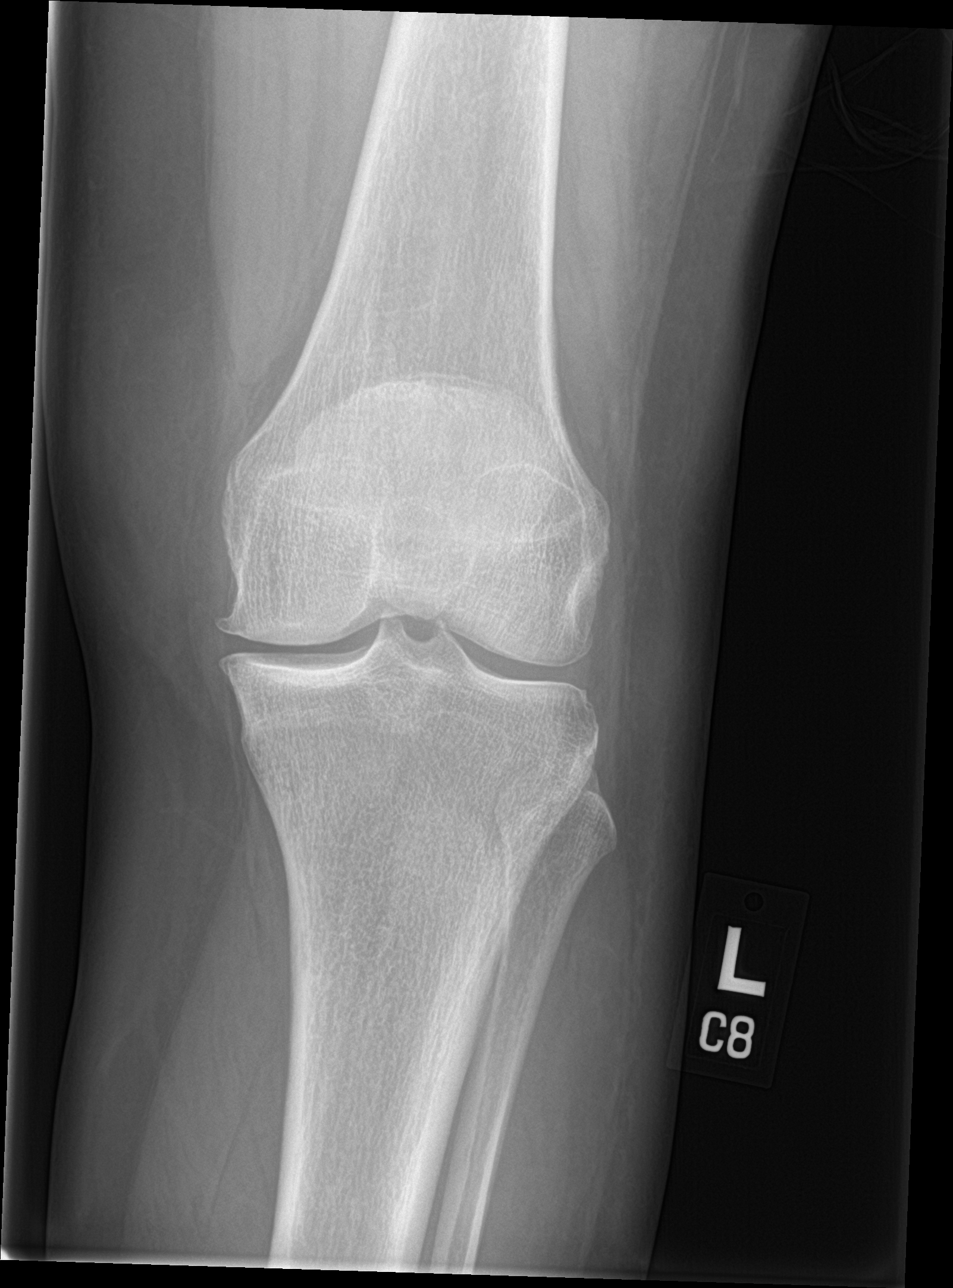

[knee lat]
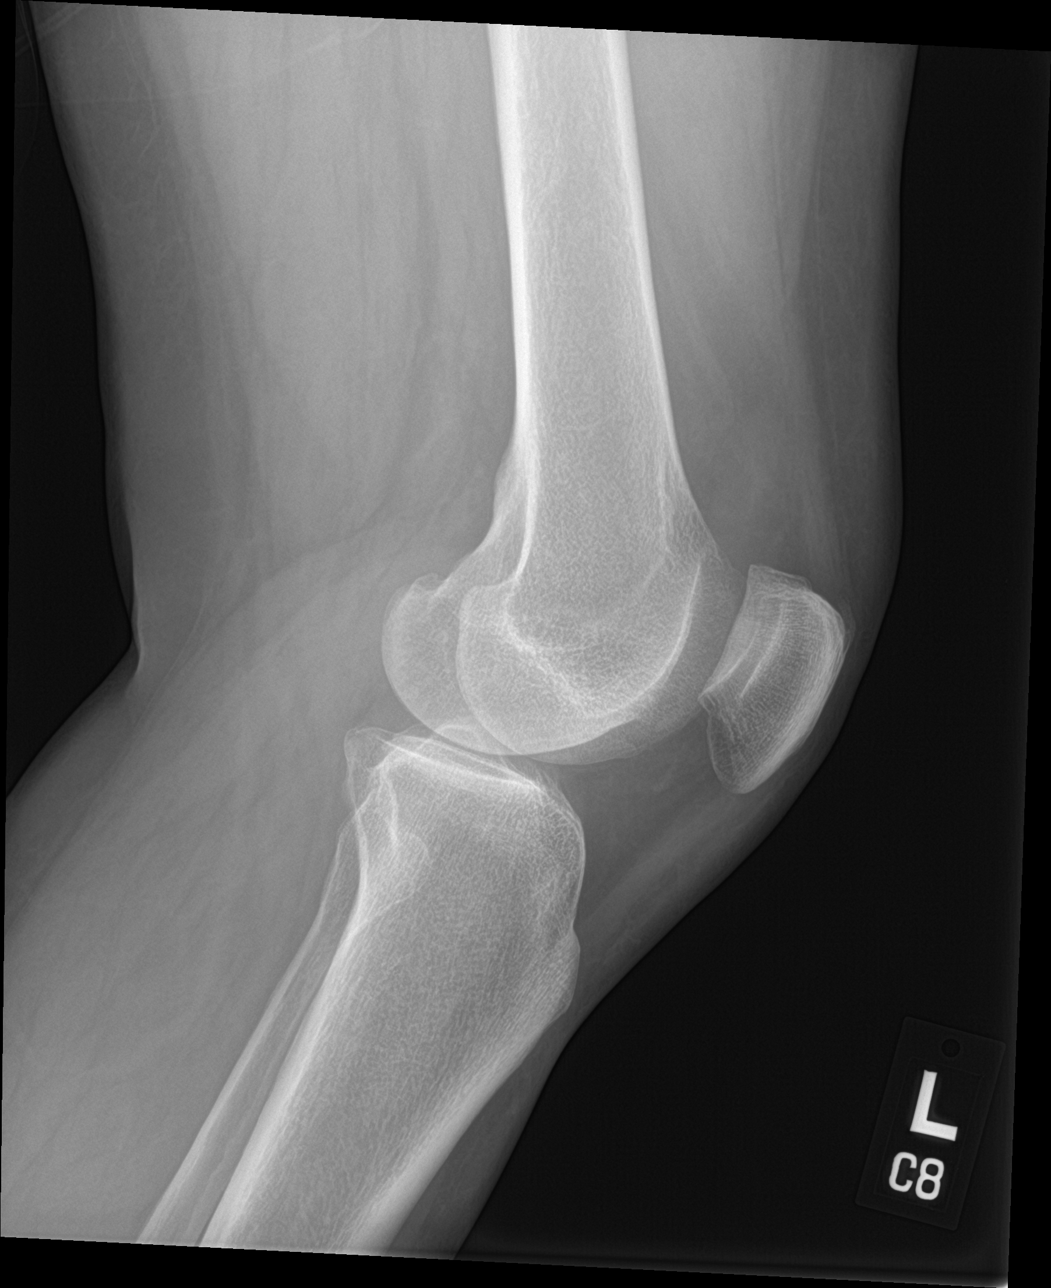

[knee ap (2 of 3)]
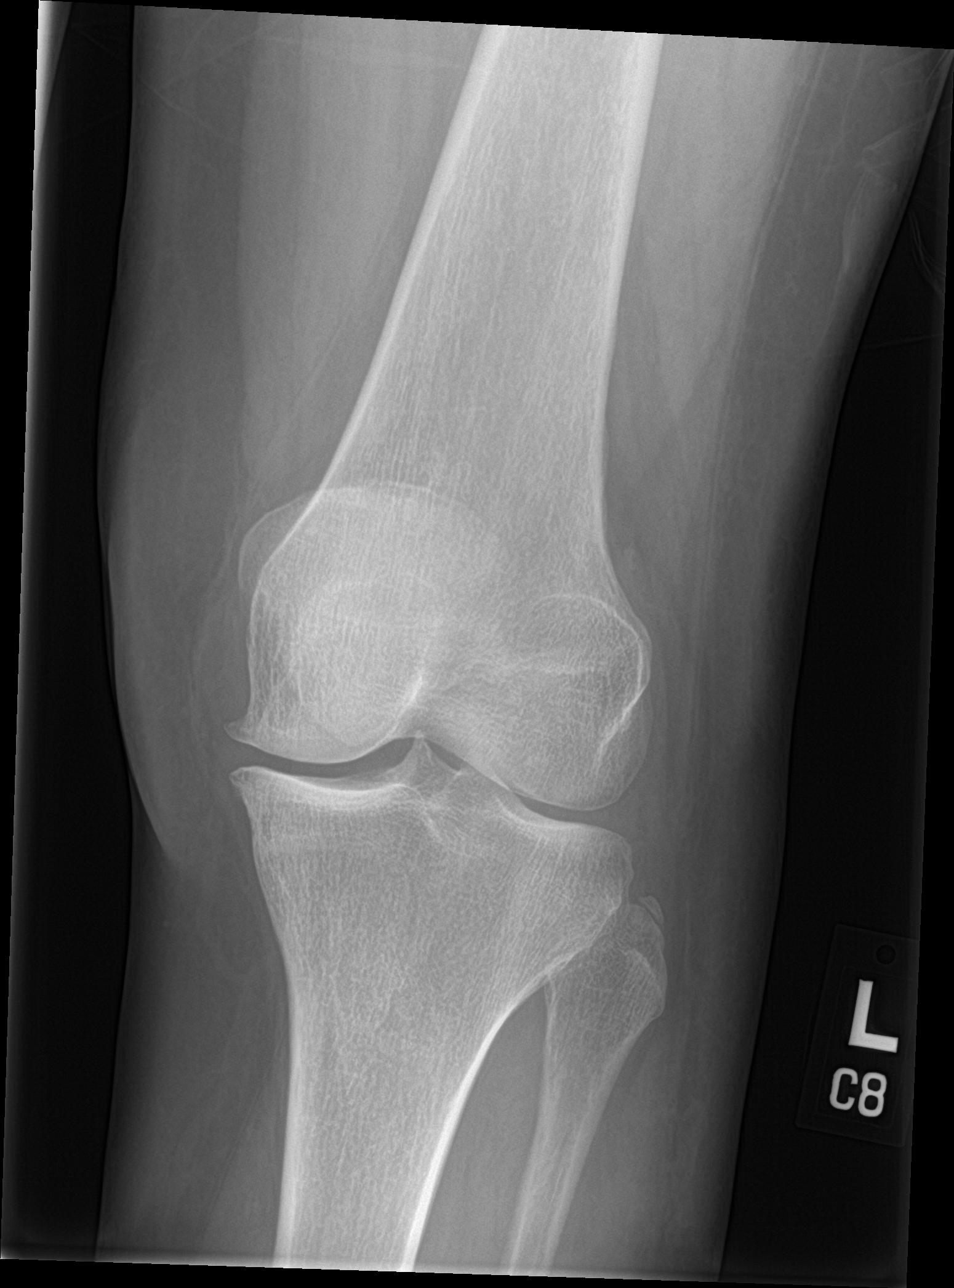

[knee ap (3 of 3)]
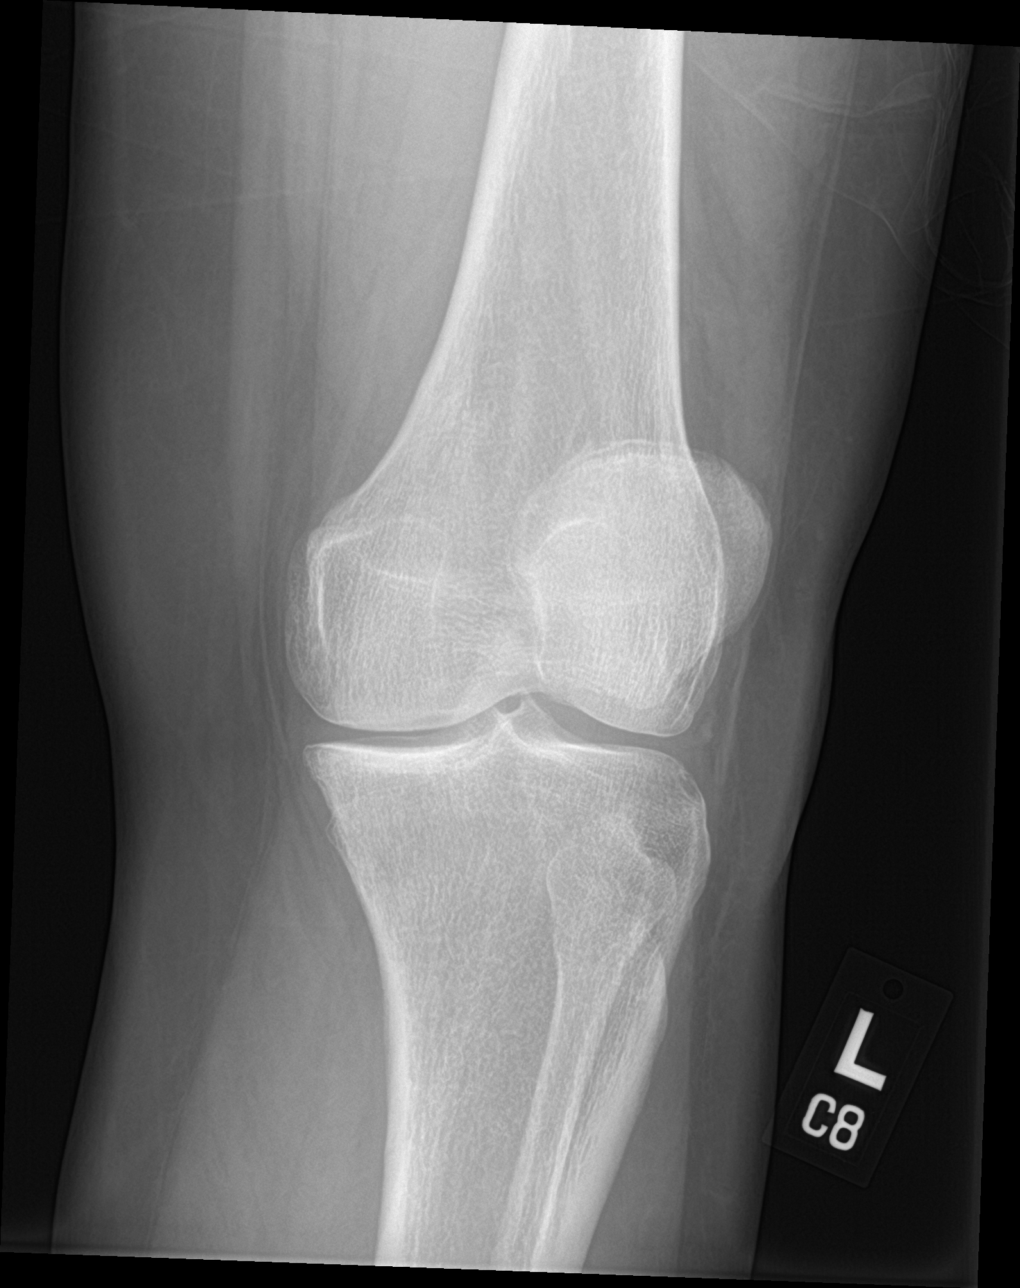

[4 of 4 positions shown; findings below may reference images not displayed]

FINDINGS: No fracture or dislocation is seen.

Mild degenerative changes, with sharpening of the tibial spines and
small medial compartment osteophytosis.

Visualized soft tissues are within normal limits.

Possible small suprapatellar knee joint effusion.
IMPRESSION: No fracture or dislocation is seen.

Mild degenerative changes with possible small suprapatellar knee
joint effusion.

## 2019-12-18 ENCOUNTER — Other Ambulatory Visit: Payer: Self-pay | Admitting: Family Medicine

## 2019-12-30 ENCOUNTER — Ambulatory Visit: Payer: BC Managed Care – PPO | Admitting: Orthopedic Surgery

## 2019-12-30 ENCOUNTER — Other Ambulatory Visit: Payer: Self-pay

## 2019-12-30 ENCOUNTER — Encounter: Payer: Self-pay | Admitting: Orthopedic Surgery

## 2019-12-30 VITALS — BP 139/79 | HR 77 | Ht 67.0 in | Wt 184.0 lb

## 2019-12-30 DIAGNOSIS — G8929 Other chronic pain: Secondary | ICD-10-CM | POA: Diagnosis not present

## 2019-12-30 DIAGNOSIS — M545 Low back pain: Secondary | ICD-10-CM | POA: Diagnosis not present

## 2019-12-30 MED ORDER — TIZANIDINE HCL 4 MG PO TABS: 4.0000 mg | ORAL_TABLET | Freq: Every day | ORAL | 2 refills | Status: DC

## 2019-12-30 NOTE — Patient Instructions (Addendum)
Arrange for physical therapy  Take the muscle relaxer at night   Chronic Back Pain When back pain lasts longer than 3 months, it is called chronic back pain.The cause of your back pain may not be known. Some common causes include:  Wear and tear (degenerative disease) of the bones, ligaments, or disks in your back.  Inflammation and stiffness in your back (arthritis). People who have chronic back pain often go through certain periods in which the pain is more intense (flare-ups). Many people can learn to manage the pain with home care. Follow these instructions at home: Pay attention to any changes in your symptoms. Take these actions to help with your pain: Activity   Avoid bending and other activities that make the problem worse.  Maintain a proper position when standing or sitting: ? When standing, keep your upper back and neck straight, with your shoulders pulled back. Avoid slouching. ? When sitting, keep your back straight and relax your shoulders. Do not round your shoulders or pull them backward.  Do not sit or stand in one place for long periods of time.  Take brief periods of rest throughout the day. This will reduce your pain. Resting in a lying or standing position is usually better than sitting to rest.  When you are resting for longer periods, mix in some mild activity or stretching between periods of rest. This will help to prevent stiffness and pain.  Get regular exercise. Ask your health care provider what activities are safe for you.  Do not lift anything that is heavier than 10 lb (4.5 kg). Always use proper lifting technique, which includes: ? Bending your knees. ? Keeping the load close to your body. ? Avoiding twisting.  Sleep on a firm mattress in a comfortable position. Try lying on your side with your knees slightly bent. If you lie on your back, put a pillow under your knees. Managing pain  If directed, apply ice to the painful area. Your health care  provider may recommend applying ice during the first 24-48 hours after a flare-up begins. ? Put ice in a plastic bag. ? Place a towel between your skin and the bag. ? Leave the ice on for 20 minutes, 2-3 times per day.  If directed, apply heat to the affected area as often as told by your health care provider. Use the heat source that your health care provider recommends, such as a moist heat pack or a heating pad. ? Place a towel between your skin and the heat source. ? Leave the heat on for 20-30 minutes. ? Remove the heat if your skin turns bright red. This is especially important if you are unable to feel pain, heat, or cold. You may have a greater risk of getting burned.  Try soaking in a warm tub.  Take over-the-counter and prescription medicines only as told by your health care provider.  Keep all follow-up visits as told by your health care provider. This is important. Contact a health care provider if:  You have pain that is not relieved with rest or medicine. Get help right away if:  You have weakness or numbness in one or both of your legs or feet.  You have trouble controlling your bladder or your bowels.  You have nausea or vomiting.  You have pain in your abdomen.  You have shortness of breath or you faint. This information is not intended to replace advice given to you by your health care provider. Make sure you  discuss any questions you have with your health care provider. Document Revised: 01/10/2019 Document Reviewed: 03/29/2017 Elsevier Patient Education  [place].

## 2019-12-30 NOTE — Progress Notes (Signed)
Kelly Buchanan  12/30/2019  Body mass index is 28.82 kg/m.   HISTORY SECTION :  Chief Complaint  Patient presents with  . Back Pain   49 year old female previously seen by primary care for lower back pain had x-rays  She has lower back pain occasional leg pain after walking for long periods of time and some morning stiffness.  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   Genitourinary: Negative.   Neurological: Tremors: .medsth.     No numbness tingling or weakness in the leg   has a past medical history of Hyperhidrosis of axilla (09/06/2016), Hyperlipidemia, and Murmur.   Past Surgical History:  Procedure Laterality Date  . APPENDECTOMY  1980's  . TONSILLECTOMY  1999  . TUBAL LIGATION  1997    Body mass index is 28.82 kg/m.   No Known Allergies   Current Outpatient Medications:  .  acetaminophen (TYLENOL) 500 MG tablet, Take 1,000 mg by mouth every 6 (six) hours as needed., Disp: , Rfl:  .  atorvastatin (LIPITOR) 10 MG tablet, Take 1 tablet (10 mg total) by mouth daily., Disp: 30 tablet, Rfl: 1 .  benzonatate (TESSALON) 100 MG capsule, Take 1 capsule (100 mg total) by mouth 3 (three) times daily as needed for cough., Disp: 20 capsule, Rfl: 0 .  cetirizine (ZYRTEC) 10 MG tablet, Take 10 mg by mouth daily as needed for allergies., Disp: , Rfl:  .  Cholecalciferol (VITAMIN D) 50 MCG (2000 UT) CAPS, Take 2,000 Units by mouth daily., Disp: , Rfl:  .  escitalopram (LEXAPRO) 10 MG tablet, Take 1 tablet (10 mg total) by mouth daily., Disp: 90 tablet, Rfl: 1 .  guaiFENesin-dextromethorphan (ROBITUSSIN DM) 100-10 MG/5ML syrup, Take 5 mLs by mouth every 4 (four) hours as needed for cough., Disp: 118 mL, Rfl: 0 .  meloxicam (MOBIC) 7.5 MG tablet, Take 1 tablet (7.5 mg total) by mouth  daily., Disp: 30 tablet, Rfl: 5 .  Multiple Vitamin (MULTIVITAMIN WITH MINERALS) TABS tablet, Take 1 tablet by mouth daily., Disp: , Rfl:  .  pantoprazole (PROTONIX) 20 MG tablet, Take 1 tablet (20 mg total) by mouth daily., Disp: 30 tablet, Rfl: 0 .  Vitamin D, Ergocalciferol, (DRISDOL) 1.25 MG (50000 UNIT) CAPS capsule, TAKE 1 CAPSULE BY MOUTH 1 TIME A WEEK, Disp: 12 capsule, Rfl: 0   PHYSICAL EXAM SECTION: 1) BP 139/79   Pulse 77   Ht 5\' 7"  (1.702 m)   Wt 184 lb (83.5 kg)   BMI 28.82 kg/m   Body mass index is 28.82 kg/m. General appearance: Well-developed well-nourished no gross deformities  2) Cardiovascular normal pulse and perfusion , normal color   3) Neurologically deep tendon reflexes are equal and normal, no sensation loss or deficits no pathologic reflexes  4) Psychological: Awake alert and oriented x3 mood and affect normal  5) Skin no lacerations or ulcerations no nodularity no palpable masses, no erythema or nodularity  6) Musculoskeletal:   Normal strength dorsiflexion plantarflexion bilaterally negative straight leg raises bilaterally normal range of motion both hips  Tenderness lower back which is exacerbated by extension and no pain with flexion reflexes are 2+ and equal sensation is normal   MEDICAL DECISION MAKING  A.  Encounter Diagnosis  Name Primary?  . Chronic  midline low back pain, unspecified whether sciatica present Yes    B. DATA ANALYSED:  IMAGING: Independent interpretation of images: Lumbar spine images x5 dated December 03, 2019 show truncal asymmetry but otherwise normal lumbar spine   Orders: Physical therapy Outside records reviewed: Primary care notes indicate patient was offered physical therapy but declined  C. MANAGEMENT   Add a muscle relaxer continue Mobic start physical therapy  Meds ordered this encounter  Medications  . tiZANidine (ZANAFLEX) 4 MG tablet    Sig: Take 1 tablet (4 mg total) by mouth at bedtime.    Dispense:   30 tablet    Refill:  2     No orders of the defined types were placed in this encounter.     Arther Abbott, MD  12/30/2019 10:47 AM

## 2019-12-31 ENCOUNTER — Ambulatory Visit (INDEPENDENT_AMBULATORY_CARE_PROVIDER_SITE_OTHER): Payer: BC Managed Care – PPO | Admitting: Family Medicine

## 2019-12-31 ENCOUNTER — Encounter: Payer: Self-pay | Admitting: Family Medicine

## 2019-12-31 VITALS — BP 120/86 | HR 67 | Temp 97.1°F | Resp 15 | Ht 67.0 in | Wt 187.0 lb

## 2019-12-31 DIAGNOSIS — E663 Overweight: Secondary | ICD-10-CM

## 2019-12-31 DIAGNOSIS — R0789 Other chest pain: Secondary | ICD-10-CM | POA: Diagnosis not present

## 2019-12-31 DIAGNOSIS — R011 Cardiac murmur, unspecified: Secondary | ICD-10-CM | POA: Diagnosis not present

## 2019-12-31 DIAGNOSIS — E785 Hyperlipidemia, unspecified: Secondary | ICD-10-CM

## 2019-12-31 HISTORY — DX: Other chest pain: R07.89

## 2019-12-31 MED ORDER — ATORVASTATIN CALCIUM 10 MG PO TABS: 10.0000 mg | ORAL_TABLET | Freq: Every day | ORAL | 1 refills | Status: DC

## 2019-12-31 NOTE — Assessment & Plan Note (Signed)
Unchanged Kelly Buchanan is re-educated about the importance of exercise daily to help with weight management. A minumum of 30 minutes daily is recommended. Additionally, importance of healthy food choices  with portion control discussed.   Wt Readings from Last 3 Encounters:  12/31/19 187 lb (84.8 kg)  12/30/19 184 lb (83.5 kg)  12/03/19 185 lb 1.9 oz (84 kg)

## 2019-12-31 NOTE — Assessment & Plan Note (Signed)
EKG to rule out heart etiology since she was to travel out of state by plane. EKG NSR. Advised when to call 911 and or to report to nearest ED. Patient acknowledged agreement and understanding of the plan.

## 2019-12-31 NOTE — Progress Notes (Signed)
Subjective:  Patient ID: Kelly Buchanan, female    DOB: [birth year]  Age: 49 y.o. MRN: [id number]  CC:  Chief Complaint  Patient presents with  . Hypertension    follow up on BP- Has noticed a heaviness in her chest when she lays flat or on her left side and thinks it could be from a heavy vest she has to wear at work       HPI  HPI  Kelly Buchanan presents back for follow-up of blood pressure.  Not currently taking anything for blood pressure was 130 over 70s at last visit.  Today appears still prehypertensive.  She does not want starting medications if she can avoid it.  At last visit encouraged again today.  She does report however that within the last month she has had left-sided chest discomfort/heaviness.  She reports that she feels it the most when she is laying flat on her left side.  It goes away when she comes upright.  She reports that it is intermittent in nature.  4 out of 10 when she feels it.  She denies having any chest pain in association with the heaviness.  Denies having any radiation of pain into the jaw neck back shoulder or arm.  Denies having nausea vomiting.  Denies having any diaphoretic spells.  Denies having any vision changes, dizziness, shortness of breath, palpitations or any other signs or symptoms of possible heart etiology.  However her father did die of a heart attack and she has a family history of heart disease.  She has a known murmur.  Reports that she did see a cardiologist some years ago.  But has not been back since.  Reports that she is about to travel out of town by plane for 1 month to be in [place] with her daughter, who is giving birth to her grandson.  She questions whether or not her heaviness is from wearing a vest but she has been wearing a vest for many years now for work.  She is unsure exactly the nature of the discomfort. Unable to reproduce with palpitation.  Today patient denies signs and symptoms of COVID 19 infection including fever,  chills, cough, shortness of breath, and headache. Past Medical, Surgical, Social History, Allergies, and Medications have been Reviewed.   Past Medical History:  Diagnosis Date  . Hyperhidrosis of axilla 09/06/2016  . Hyperlipidemia   . Murmur     Current Meds  Medication Sig  . acetaminophen (TYLENOL) 500 MG tablet Take 1,000 mg by mouth every 6 (six) hours as needed.  Marland Kitchen atorvastatin (LIPITOR) 10 MG tablet Take 1 tablet (10 mg total) by mouth daily.  . cetirizine (ZYRTEC) 10 MG tablet Take 10 mg by mouth daily as needed for allergies.  . Cholecalciferol (VITAMIN D) 50 MCG (2000 UT) CAPS Take 2,000 Units by mouth daily.  Marland Kitchen escitalopram (LEXAPRO) 10 MG tablet Take 1 tablet (10 mg total) by mouth daily.  . meloxicam (MOBIC) 7.5 MG tablet Take 1 tablet (7.5 mg total) by mouth daily.  . Multiple Vitamin (MULTIVITAMIN WITH MINERALS) TABS tablet Take 1 tablet by mouth daily.  . pantoprazole (PROTONIX) 20 MG tablet Take 1 tablet (20 mg total) by mouth daily.  Marland Kitchen tiZANidine (ZANAFLEX) 4 MG tablet Take 1 tablet (4 mg total) by mouth at bedtime.  . Vitamin D, Ergocalciferol, (DRISDOL) 1.25 MG (50000 UNIT) CAPS capsule TAKE 1 CAPSULE BY MOUTH 1 TIME A WEEK  . [DISCONTINUED] atorvastatin (LIPITOR) 10 MG  tablet Take 1 tablet (10 mg total) by mouth daily.    ROS:  Review of Systems  Constitutional: Negative.   HENT: Negative.   Eyes: Negative.   Respiratory: Negative.   Cardiovascular:       Chest heaviness  Gastrointestinal: Negative.   Genitourinary: Negative.   Musculoskeletal: Negative.   Skin: Negative.   Neurological: Negative.   Endo/Heme/Allergies: Negative.   Psychiatric/Behavioral: Negative.   All other systems reviewed and are negative.    Objective:   Today's Vitals: BP 120/86   Pulse 67   Temp (!) 97.1 F (36.2 C) (Temporal)   Resp 15   Ht 5\' 7"  (1.702 m)   Wt 187 lb (84.8 kg)   SpO2 99%   BMI 29.29 kg/m  Vitals with BMI 12/31/2019 12/30/2019 12/03/2019  Height 5'  7" 5\' 7"  5\' 7"   Weight 187 lbs 184 lbs 185 lbs 2 oz  BMI 29.28 [id number] [id number]  Systolic [id number]  Diastolic 86 79 78  Pulse 67 77 86     Physical Exam Vitals and nursing note reviewed.  Constitutional:      Appearance: Normal appearance. She is well-developed, well-groomed and overweight.  HENT:     Head: Normocephalic and atraumatic.     Right Ear: External ear normal.     Left Ear: External ear normal.     Mouth/Throat:     Comments: Mask in place  Eyes:     General:        Right eye: No discharge.        Left eye: No discharge.     Conjunctiva/sclera: Conjunctivae normal.  Cardiovascular:     Rate and Rhythm: Normal rate and regular rhythm.     Pulses: Normal pulses.     Heart sounds: Murmur present.  Pulmonary:     Effort: Pulmonary effort is normal.     Breath sounds: Normal breath sounds.  Chest:     Chest wall: No mass, deformity, swelling, tenderness or crepitus.  Musculoskeletal:     Cervical back: Normal range of motion and neck supple.  Skin:    General: Skin is warm.  Neurological:     General: No focal deficit present.     Mental Status: She is alert and oriented to person, place, and time.  Psychiatric:        Attention and Perception: Attention and perception normal.        Mood and Affect: Mood and affect normal.        Speech: Speech normal.        Behavior: Behavior normal. Behavior is cooperative.        Thought Content: Thought content normal.        Cognition and Memory: Cognition and memory normal.        Judgment: Judgment normal.    EKG: NSR 64 BPM-personally reviewed   Assessment   1. Heart murmur   2. Hyperlipidemia, unspecified hyperlipidemia type   3. Overweight (BMI 25.0-29.9)   4. Chest heaviness     Tests ordered No orders of the defined types were placed in this encounter.    Plan:   Please see assessment and plan per problem list above.   Meds ordered this encounter  Medications  . atorvastatin (LIPITOR) 10 MG  tablet    Sig: Take 1 tablet (10 mg total) by mouth daily.    Dispense:  90 tablet    Refill:  1    Patient to follow-up in 3  months   [name], NP

## 2019-12-31 NOTE — Patient Instructions (Addendum)
I appreciate the opportunity to provide you with care for your health and wellness. Today we discussed: BP and chest heaviness  Follow up:  June-July annual visit-be fasting if able so we can get labs that day  No labs or referrals today  EKG normal today-so traveling should be good and safe. Please continue to wear a mask and be COVID cautious.  HAVE SAFE TRAVELS AND CONGRATS ON THE UPCOMING NEW BUNDLE OF JOY!  Please continue to practice social distancing to keep you, your family, and our community safe.  If you must go out, please wear a mask and practice good handwashing.  It was a pleasure to see you and I look forward to continuing to work together on your health and well-being. Please do not hesitate to call the office if you need care or have questions about your care.  Have a wonderful day and week. With Gratitude, Cherly Beach, DNP, AGNP-BC

## 2020-02-06 ENCOUNTER — Ambulatory Visit (HOSPITAL_COMMUNITY): Payer: BC Managed Care – PPO | Admitting: Physical Therapy

## 2020-02-10 ENCOUNTER — Encounter (HOSPITAL_COMMUNITY): Payer: Self-pay | Admitting: Physical Therapy

## 2020-02-10 ENCOUNTER — Other Ambulatory Visit: Payer: Self-pay

## 2020-02-10 ENCOUNTER — Ambulatory Visit (HOSPITAL_COMMUNITY): Payer: BC Managed Care – PPO | Attending: Orthopedic Surgery | Admitting: Physical Therapy

## 2020-02-10 DIAGNOSIS — M5416 Radiculopathy, lumbar region: Secondary | ICD-10-CM

## 2020-02-10 DIAGNOSIS — M6281 Muscle weakness (generalized): Secondary | ICD-10-CM

## 2020-02-10 NOTE — Therapy (Signed)
[facility] [address] [place], [place], [address] Phone: [phone]   Fax:  [phone]  Physical Therapy Evaluation  Patient Details  Name: Kelly Buchanan MRN: [id number] Date of Birth: [birth year] Referring Provider (PT): Arther Abbott    Encounter Date: 02/10/2020  PT End of Session - 02/10/20 1350    Visit Number  1    Number of Visits  12    Date for PT Re-Evaluation  03/23/20    Progress Note Due on Visit  10   foto visit 5   PT Start Time  1310    PT Stop Time  1348    PT Time Calculation (min)  38 min    Equipment Utilized During Treatment  Gait belt    Activity Tolerance  Patient tolerated treatment well       Past Medical History:  Diagnosis Date  . Hyperhidrosis of axilla 09/06/2016  . Hyperlipidemia   . Murmur     Past Surgical History:  Procedure Laterality Date  . APPENDECTOMY  1980's  . TONSILLECTOMY  1999  . TUBAL LIGATION  1997    There were no vitals filed for this visit.   Subjective Assessment - 02/10/20 1315    Subjective  Kelly Buchanan has had back pain on and off for years.  She started having increased pain at the beginning of the year.  She has had two injections in her hips but she is still having pain.  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   Pt states that sittting is the most painful position for her.  She has pain that goes into her Rt foot.  The radiating pain occurs about twice a week.    Limitations  Sitting;Lifting;House hold activities    How long can you sit comfortably?  10-15 minutes    How long can you stand comfortably?  5-10 minutes    How long can you walk comfortably?  walking is better    Patient Stated Goals  less pain    Currently in Pain?  Yes    Pain Score  4    will go as high as a 10/10   Pain Location  Back    Pain Orientation  Upper;Lower    Pain Descriptors / Indicators  Throbbing    Pain Type   Chronic pain    Pain Radiating Towards  occasionally to the right leg    Pain Onset  More than a month ago    Pain Frequency  Constant    Aggravating Factors   sitting    Pain Relieving Factors  heat, meds    Effect of Pain on Daily Activities  pt is doing all her normal activities she just pushes through the pain         [facility] PT Assessment - 02/10/20 0001      Assessment   Medical Diagnosis  Mid/low back pain     Referring Provider (PT)  Arther Abbott     Onset Date/Surgical Date  10/04/19   recent exacerbation    Next MD Visit  six weeks     Prior Therapy  once quite a while ago       Precautions   Precautions  None      Restrictions   Weight Bearing Restrictions  No      Balance Screen   Has the patient fallen in the  past 6 months  No    Has the patient had a decrease in activity level because of a fear of falling?   No    Is the patient reluctant to leave their home because of a fear of falling?   No      Prior Function   Level of Independence  Independent    Vocation  Full time employment    Vocation Requirements  walking/sitting     Leisure  reading        Cognition   Overall Cognitive Status  Within Functional Limits for tasks assessed      Observation/Other Assessments   Focus on Therapeutic Outcomes (FOTO)   56; 44 % limited       Functional Tests   Functional tests  Single leg stance;Sit to Stand      Single Leg Stance   Comments  Rt and Lt ; 60 secods       Sit to Stand   Comments  11 in 30 seconds       ROM / Strength   AROM / PROM / Strength  AROM;Strength      AROM   AROM Assessment Site  Lumbar    Lumbar Flexion  fingers to toes reps increased pain.     Lumbar Extension  25    Lumbar - Right Side Bend  28    Lumbar - Left Side Bend  25      Strength   Strength Assessment Site  Hip;Knee;Ankle    Right/Left Hip  Right;Left    Right Hip Flexion  4+/5    Right Hip Extension  4/5    Right Hip ABduction  5/5    Left Hip Flexion  5/5     Left Hip Extension  4/5    Left Hip ABduction  5/5    Right/Left Knee  Right;Left    Right Knee Flexion  5/5    Right Knee Extension  4+/5    Left Knee Flexion  4+/5    Left Knee Extension  5/5    Right/Left Ankle  Right;Left    Right Ankle Dorsiflexion  4/5    Left Ankle Dorsiflexion  5/5      Flexibility   Soft Tissue Assessment /Muscle Length  no                Objective measurements completed on examination: See above findings.   Standing extension bridge POE Press up          PT Education - 02/10/20 1349    Education Details  HEP    Person(s) Educated  Patient    Methods  Explanation;Demonstration;Tactile cues;Verbal cues;Handout    Comprehension  Verbalized understanding;Returned demonstration       PT Short Term Goals - 02/10/20 1356      PT SHORT TERM GOAL #1   Title  Pt to be I in HEP to allow pain level to be no greater than a 7/10.    Time  3    Period  Weeks    Status  New    Target Date  03/02/20      PT SHORT TERM GOAL #2   Title  PT radiating pain to be to knee level only to demonstrate decreased nerve irritation    Time  3    Period  Weeks      PT SHORT TERM GOAL #3   Title  PT to be able to sit for 30 minutes to  be comfortable riding into work    Time  3    Status  New        PT Long Term Goals - 02/10/20 1358      PT LONG TERM GOAL #1   Title  PT to be I in advance HEP to allow pain level to be no greater than a 3/10 .    Time  6    Period  Weeks    Status  New    Target Date  03/23/20      PT LONG TERM GOAL #2   Title  PT to have no radiating pain    Time  6    Period  Weeks    Status  New      PT LONG TERM GOAL #3   Title  Pt to be able to sit for 2 hours without  having increased pain to be able to sit comfortably thru a movie.    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  Her recent exacerbation has been since January of this year.  Kelly Buchanan believes that her pain stems from having to wear her belt at work which is very heavy.  Examination demonstrates decreased activity level, decreased strength and increased pain.  Kelly Buchanan has a positive bias towards extension. She will benefit from skilled PT to address the above deficits and decrease her pain level    Personal Factors and Comorbidities  Past/Current Experience;Profession;Time since onset of injury/illness/exacerbation    Examination-Activity Limitations  Sit;Stand;Locomotion Level    Examination-Participation Restrictions  Other    Stability/Clinical Decision Making  Stable/Uncomplicated    Clinical Decision Making  Moderate    Rehab Potential  Good    PT Frequency  2x / week    PT Duration  6 weeks    PT Treatment/Interventions  Therapeutic activities;Therapeutic exercise;Patient/family education;Manual techniques    PT Next Visit Plan  begin postural tband exercises ; Paloff exercises, prone sLR and opposite arm/leg .  Progress core stability exercises as able    PT Home Exercise Plan  extension, POE, press up , bridge.       Patient will benefit from skilled therapeutic intervention in order to improve the following deficits and impairments:  Pain, Decreased strength, Hypomobility  Visit Diagnosis: Lumbar radiculopathy  Muscle weakness (generalized)     Problem List Patient Active Problem List   Diagnosis Date Noted  . Chest heaviness 12/31/2019  . Chronic bilateral low back pain with right-sided sciatica 12/03/2019  . Essential hypertension 12/03/2019  . GERD (gastroesophageal reflux disease) 09/23/2019  . Fatigue 09/23/2019  . Primary osteoarthritis of left knee 08/27/2019  . Pain and  swelling of knee, left 07/24/2019  . Overweight (BMI 25.0-29.9) 07/24/2019  . Vitamin D deficiency 09/07/2016  . Heart murmur 09/06/2016  . Hyperlipidemia 09/06/2016  Rayetta Humphrey, PT CLT [id number] 02/10/2020, 2:03 PM  [place]  [facility] [place], [place], [address] Phone: [phone]   Fax:  [phone]  Name: MAKAYLEN BLAKELY MRN: [id number] Date of Birth: 1971-05-28

## 2020-02-14 ENCOUNTER — Ambulatory Visit (HOSPITAL_COMMUNITY): Payer: BC Managed Care – PPO | Admitting: Physical Therapy

## 2020-02-14 ENCOUNTER — Encounter (HOSPITAL_COMMUNITY): Payer: Self-pay | Admitting: Physical Therapy

## 2020-02-14 ENCOUNTER — Other Ambulatory Visit: Payer: Self-pay

## 2020-02-14 DIAGNOSIS — M6281 Muscle weakness (generalized): Secondary | ICD-10-CM | POA: Diagnosis not present

## 2020-02-14 DIAGNOSIS — M5416 Radiculopathy, lumbar region: Secondary | ICD-10-CM | POA: Diagnosis not present

## 2020-02-14 NOTE — Patient Instructions (Signed)
Access Code: [email or web address] WCC URL: [email or web address] Date: 02/14/2020 Prepared by: Josue Hector  Exercises Supine March - 2 x daily - 7 x weekly - 2 sets - 10 reps Supine Dead Bug with Leg Extension - 2 x daily - 7 x weekly - 2 sets - 10 reps Bird Dog - 2 x daily - 7 x weekly - 2 sets - 10 reps

## 2020-02-14 NOTE — Therapy (Signed)
[facility] [place], [place], [address] Phone: [phone]   Fax:  [phone]  Physical Therapy Treatment  Patient Details  Name: Kelly Buchanan MRN: [id number] Date of Birth: 1970-12-21 Referring Provider (PT): Arther Abbott    Encounter Date: 02/14/2020  PT End of Session - 02/14/20 1507    Visit Number  2    Number of Visits  12    Date for PT Re-Evaluation  03/23/20    Progress Note Due on Visit  10   foto visit 5   PT Start Time  1512    PT Stop Time  1555    PT Time Calculation (min)  43 min    Equipment Utilized During Treatment  --    Activity Tolerance  Patient tolerated treatment well;Patient limited by fatigue    Behavior During Therapy  [facility] for tasks assessed/performed       Past Medical History:  Diagnosis Date  . Hyperhidrosis of axilla 09/06/2016  . Hyperlipidemia   . Murmur     Past Surgical History:  Procedure Laterality Date  . APPENDECTOMY  1980's  . TONSILLECTOMY  1999  . TUBAL LIGATION  1997    There were no vitals filed for this visit.  Subjective Assessment - 02/14/20 1514    Subjective  Patient says she is doing alright, back feels OK today "not a lot of pain". Reports compliance with HEP with no issues.    Limitations  Sitting;Lifting;House hold activities    How long can you sit comfortably?  10-15 minutes    How long can you stand comfortably?  5-10 minutes    How long can you walk comfortably?  walking is better    Patient Stated Goals  less pain    Currently in Pain?  Yes    Pain Score  2     Pain Location  Back    Pain Orientation  Upper;Lower    Pain Descriptors / Indicators  Tightness    Pain Type  Chronic pain    Pain Onset  More than a month ago                        [facility] Adult PT Treatment/Exercise - 02/14/20 0001      Lumbar Exercises: Stretches   Prone on Elbows Stretch  1 rep;60 seconds    Press Ups  10 reps      Lumbar Exercises:  Standing   Other Standing Lumbar Exercises  repeated extension in standing (REIS) x10       Lumbar Exercises: Supine   Bent Knee Raise  20 reps    Dead Bug  20 reps    Dead Bug Limitations  10 modified with feet on mat; 10 standard     Bridge  10 reps;5 seconds    Straight Leg Raise  20 reps      Lumbar Exercises: Prone   Straight Leg Raise  20 reps      Lumbar Exercises: Quadruped   Opposite Arm/Leg Raise  10 reps               PT Short Term Goals - 02/10/20 1356      PT SHORT TERM GOAL #1   Title  Pt to be I in HEP to allow pain level to be no greater than a 7/10.    Time  3    Period  Weeks    Status  New    Target Date  03/02/20      PT SHORT TERM GOAL #2   Title  PT radiating pain to be to knee level only to demonstrate decreased nerve irritation    Time  3    Period  Weeks      PT SHORT TERM GOAL #3   Title  PT to be able to sit for 30 minutes to be comfortable riding into work    Time  3    Status  New        PT Long Term Goals - 02/10/20 1358      PT LONG TERM GOAL #1   Title  PT to be I in advance HEP to allow pain level to be no greater than a 3/10 .    Time  6    Period  Weeks    Status  New    Target Date  03/23/20      PT LONG TERM GOAL #2   Title  PT to have no radiating pain    Time  6    Period  Weeks    Status  New      PT LONG TERM GOAL #3   Title  Pt to be able to sit for 2 hours without  having increased pain to be able to sit comfortably thru a movie.    Time  6    Period  Weeks    Status  New      PT LONG TERM GOAL #4   Title  PT to be able to walk/stand for up to two hours to complete work duties/ go to commuinty events    Time  6    Period  Weeks    Status  New            Plan - 02/14/20 1607    Clinical Impression Statement  Patient tolerated session well today. Reviewed therapy goals and HEP. Patient with overall good return on form with prior exercise. Patient cued on relaxing lumbar and hips to improve  lumbar extension with prone press ups. Able to progress hip and core strengthening exercise today. Patient educated on proper form and function of all added activity. Patient noted slight discomfort in RT low back with LT prone hip extension. Patient was well challenged with added dead bugs noting increased muscle fatigue. Patient cued for maintaining level pelvis during birddogs. Patient educated on and issued updated HEP handout. Patient reports decreased pain post treatment. Patient will continue to benefit from skilled therapy service to progress core straightening to reduced pain and improve LOF with ADLs and work tasks.    Personal Factors and Comorbidities  Past/Current Experience;Profession;Time since onset of injury/illness/exacerbation    Examination-Activity Limitations  Sit;Stand;Locomotion Level    Examination-Participation Restrictions  Other    Stability/Clinical Decision Making  Stable/Uncomplicated    Rehab Potential  Good    PT Frequency  2x / week    PT Duration  6 weeks    PT Treatment/Interventions  Therapeutic activities;Therapeutic exercise;Patient/family education;Manual techniques    PT Next Visit Plan  begin postural tband exercises ; Paloff exercises Progress core stability exercises as able    PT Home Exercise Plan  extension, POE, press up , bridge. 02/14/20: ab marching, deadbug, birddog    Consulted and Agree with Plan of Care  Patient       Patient will benefit from skilled therapeutic intervention in order to improve the following deficits and impairments:  Pain, Decreased strength, Hypomobility  Visit Diagnosis: Lumbar radiculopathy  Muscle weakness (generalized)     Problem List Patient Active Problem List   Diagnosis Date Noted  . Chest heaviness 12/31/2019  . Chronic bilateral low back pain with right-sided sciatica 12/03/2019  . Essential hypertension 12/03/2019  . GERD (gastroesophageal reflux disease) 09/23/2019  . Fatigue 09/23/2019  . Primary  osteoarthritis of left knee 08/27/2019  . Pain and swelling of knee, left 07/24/2019  . Overweight (BMI 25.0-29.9) 07/24/2019  . Vitamin D deficiency 09/07/2016  . Heart murmur 09/06/2016  . Hyperlipidemia 09/06/2016   4:10 PM, 02/14/20 Josue Hector PT DPT  Physical Therapist with [facility]  [phone] [place], [place], [address] Phone: [phone]   Fax:  [phone]  Name: Kelly Buchanan MRN: [id number] Date of Birth: April 28, 1971

## 2020-02-19 ENCOUNTER — Ambulatory Visit (HOSPITAL_COMMUNITY): Payer: BC Managed Care – PPO | Admitting: Physical Therapy

## 2020-02-19 ENCOUNTER — Other Ambulatory Visit: Payer: Self-pay

## 2020-02-19 DIAGNOSIS — M5416 Radiculopathy, lumbar region: Secondary | ICD-10-CM | POA: Diagnosis not present

## 2020-02-19 DIAGNOSIS — M6281 Muscle weakness (generalized): Secondary | ICD-10-CM | POA: Diagnosis not present

## 2020-02-19 NOTE — Therapy (Signed)
[facility] [place], [place], [address] Phone: [phone]   Fax:  [phone]  Physical Therapy Treatment  Patient Details  Name: Kelly Buchanan MRN: [id number] Date of Birth: 04-May-1971 Referring Provider (PT): Arther Abbott    Encounter Date: 02/19/2020  PT End of Session - 02/19/20 1523    Visit Number  3    Number of Visits  12    Date for PT Re-Evaluation  03/23/20    Progress Note Due on Visit  10   foto visit 5   PT Start Time  [id number]    PT Stop Time  1445    PT Time Calculation (min)  40 min    Activity Tolerance  Patient tolerated treatment well;Patient limited by fatigue    Behavior During Therapy  [facility] for tasks assessed/performed       Past Medical History:  Diagnosis Date  . Hyperhidrosis of axilla 09/06/2016  . Hyperlipidemia   . Murmur     Past Surgical History:  Procedure Laterality Date  . APPENDECTOMY  1980's  . TONSILLECTOMY  1999  . TUBAL LIGATION  1997    There were no vitals filed for this visit.  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    Currently in Pain?  No/denies                        [facility] Adult PT Treatment/Exercise - 02/19/20 0001      Lumbar Exercises: Stretches   Single Knee to Chest Stretch  5 reps;20 seconds    Prone on Elbows Stretch  1 rep;60 seconds    Press Ups  10 reps      Lumbar Exercises: Standing   Heel Raises  15 reps    Functional Squats  10 reps    Scapular Retraction  Both;10 reps;Theraband    Theraband Level (Scapular Retraction)  Level 2 (Red)    Row  Both;10 reps;Theraband    Theraband Level (Row)  Level 2 (Red)    Shoulder Extension  Both;10 reps;Theraband    Theraband Level (Shoulder Extension)  Level 2 (Red)    Other Standing Lumbar Exercises  repeated extension in standing (REIS) x10     Other Standing  Lumbar Exercises  palloff press RTB 10 reps each tandem (40 reps total)      Lumbar Exercises: Supine   Bent Knee Raise  20 reps    Dead Bug  20 reps    Bridge  10 reps;5 seconds    Straight Leg Raise  20 reps      Lumbar Exercises: Prone   Straight Leg Raise  10 reps      Lumbar Exercises: Quadruped   Opposite Arm/Leg Raise  10 reps               PT Short Term Goals - 02/10/20 1356      PT SHORT TERM GOAL #1   Title  Pt to be I in HEP to allow pain level to be no greater than a 7/10.    Time  3    Period  Weeks    Status  New    Target Date  03/02/20      PT SHORT TERM GOAL #2   Title  PT radiating pain to be to knee level only  to demonstrate decreased nerve irritation    Time  3    Period  Weeks      PT SHORT TERM GOAL #3   Title  PT to be able to sit for 30 minutes to be comfortable riding into work    Time  3    Status  New        PT Long Term Goals - 02/10/20 1358      PT LONG TERM GOAL #1   Title  PT to be I in advance HEP to allow pain level to be no greater than a 3/10 .    Time  6    Period  Weeks    Status  New    Target Date  03/23/20      PT LONG TERM GOAL #2   Title  PT to have no radiating pain    Time  6    Period  Weeks    Status  New      PT LONG TERM GOAL #3   Title  Pt to be able to sit for 2 hours without  having increased pain to be able to sit comfortably thru a movie.    Time  6    Period  Weeks    Status  New      PT LONG TERM GOAL #4   Title  PT to be able to walk/stand for up to two hours to complete work duties/ go to commuinty events    Time  6    Period  Weeks    Status  New            Plan - 02/19/20 1524    Clinical Impression Statement  Continued focus on core stability and pain reduction.  Began postural strengthening with theraband and palloff press to challenge core.  Cues needed for general form with SLR (keeping opp knee bent) and dead bug.    Personal Factors and Comorbidities  Past/Current  Experience;Profession;Time since onset of injury/illness/exacerbation    Examination-Activity Limitations  Sit;Stand;Locomotion Level    Examination-Participation Restrictions  Other    Stability/Clinical Decision Making  Stable/Uncomplicated    Rehab Potential  Good    PT Frequency  2x / week    PT Duration  6 weeks    PT Treatment/Interventions  Therapeutic activities;Therapeutic exercise;Patient/family education;Manual techniques    PT Next Visit Plan  continue to progress core stability and postural strength as tolerated.    PT Home Exercise Plan  extension, POE, press up , bridge. 02/14/20: ab marching, deadbug, birddog    Consulted and Agree with Plan of Care  Patient       Patient will benefit from skilled therapeutic intervention in order to improve the following deficits and impairments:  Pain, Decreased strength, Hypomobility  Visit Diagnosis: Lumbar radiculopathy     Problem List Patient Active Problem List   Diagnosis Date Noted  . Chest heaviness 12/31/2019  . Chronic bilateral low back pain with right-sided sciatica 12/03/2019  . Essential hypertension 12/03/2019  . GERD (gastroesophageal reflux disease) 09/23/2019  . Fatigue 09/23/2019  . Primary osteoarthritis of left knee 08/27/2019  . Pain and swelling of knee, left 07/24/2019  . Overweight (BMI 25.0-29.9) 07/24/2019  . Vitamin D deficiency 09/07/2016  . Heart murmur 09/06/2016  . Hyperlipidemia 09/06/2016   Teena Irani, PTA/CLT [phone]  Teena Irani 02/19/2020, 3:27 PM  [facility] [address] [place], [place], [address] Phone:  [phone]   Fax:  [phone]  Name: Kelly Buchanan MRN: [id number] Date of Birth: 25-Dec-1970

## 2020-02-20 ENCOUNTER — Ambulatory Visit (HOSPITAL_COMMUNITY): Payer: BC Managed Care – PPO | Admitting: Physical Therapy

## 2020-02-20 ENCOUNTER — Telehealth (HOSPITAL_COMMUNITY): Payer: Self-pay | Admitting: Physical Therapy

## 2020-02-20 NOTE — Telephone Encounter (Signed)
pt called to cx today's appt due to she had to work

## 2020-02-25 ENCOUNTER — Ambulatory Visit (HOSPITAL_COMMUNITY): Payer: BC Managed Care – PPO | Admitting: Physical Therapy

## 2020-02-25 ENCOUNTER — Telehealth (HOSPITAL_COMMUNITY): Payer: Self-pay | Admitting: Physical Therapy

## 2020-02-25 NOTE — Telephone Encounter (Signed)
Pt did not show for appointment.  Called and phone rang twice and switched to a busy signal. Attempted X 2 trials with same result.  Will need to follow up with patient if she returns if there is another number she can provide [place] with.  Teena Irani, PTA/CLT [phone]

## 2020-02-28 ENCOUNTER — Encounter (HOSPITAL_COMMUNITY): Payer: Self-pay | Admitting: Physical Therapy

## 2020-02-28 ENCOUNTER — Ambulatory Visit (HOSPITAL_COMMUNITY): Payer: BC Managed Care – PPO | Admitting: Physical Therapy

## 2020-02-28 ENCOUNTER — Telehealth (HOSPITAL_COMMUNITY): Payer: Self-pay | Admitting: Physical Therapy

## 2020-02-28 NOTE — Telephone Encounter (Signed)
pt cancelled her ramaing appts and wants to be discharged

## 2020-02-28 NOTE — Therapy (Signed)
[facility] [address] [place], [place], [address] Phone: [phone]   Fax:  [phone]  Patient Details  Name: Kelly Buchanan MRN: [id number] Date of Birth: 06/25/1971 Referring Provider:  No ref. provider found  Encounter Date: 02/28/2020   PHYSICAL THERAPY DISCHARGE SUMMARY  Visits from Start of Care: 3  Current functional level related to goals / functional outcomes: Unknown as pt called and cancelled unable to obtain objective measurements.  Last treatment on 5/19 pt was painfree    Remaining deficits: Unknown    Education / Equipment: HEP the importance of posture and bed mobility body mechanics  Plan: Patient agrees to discharge.  Patient goals were partially met. Patient is being discharged due to the patient's request.  ?????      Rayetta Humphrey, PT CLT [phone] 02/28/2020, 9:24 AM  [facility] [address] [place], [place], [address] Phone: [phone]   Fax:  [phone]

## 2020-03-03 ENCOUNTER — Encounter (HOSPITAL_COMMUNITY): Payer: BC Managed Care – PPO | Admitting: Physical Therapy

## 2020-03-06 ENCOUNTER — Encounter (HOSPITAL_COMMUNITY): Payer: BC Managed Care – PPO | Admitting: Physical Therapy

## 2020-03-16 ENCOUNTER — Other Ambulatory Visit: Payer: Self-pay | Admitting: Family Medicine

## 2020-03-17 ENCOUNTER — Ambulatory Visit (INDEPENDENT_AMBULATORY_CARE_PROVIDER_SITE_OTHER): Payer: BC Managed Care – PPO | Admitting: Family Medicine

## 2020-03-17 ENCOUNTER — Encounter: Payer: Self-pay | Admitting: Family Medicine

## 2020-03-17 ENCOUNTER — Other Ambulatory Visit: Payer: Self-pay

## 2020-03-17 VITALS — BP 138/82 | HR 76 | Temp 97.1°F | Ht 67.0 in | Wt 183.1 lb

## 2020-03-17 DIAGNOSIS — M5442 Lumbago with sciatica, left side: Secondary | ICD-10-CM | POA: Diagnosis not present

## 2020-03-17 MED ORDER — METHYLPREDNISOLONE ACETATE 80 MG/ML IJ SUSP
80.0000 mg | Freq: Once | INTRAMUSCULAR | Status: AC
  Administered 2020-03-17: 80 mg via INTRAMUSCULAR

## 2020-03-17 MED ORDER — KETOROLAC TROMETHAMINE 60 MG/2ML IM SOLN
60.0000 mg | Freq: Once | INTRAMUSCULAR | Status: AC
  Administered 2020-03-17: 60 mg via INTRAMUSCULAR

## 2020-03-17 NOTE — Progress Notes (Signed)
Subjective:  Patient ID: Kelly Buchanan, female    DOB: [birth year]  Age: 49 y.o. MRN: [id number]  CC:  Chief Complaint  Patient presents with  . Back Pain      HPI  HPI   Kelly Buchanan is a 49 year old female patient of mine.  She presents today for ongoing back pain.  Is been off that she went to the physical therapist.  She went for 2 weeks but it was too expensive for her to continue doing so.  She reports that her pain is a 10 out of 10 right now.  When she remains more in the morning than in the afternoon but hurts bad.  Feels nauseous when trying to take ibuprofen and Tylenol is not helpful.  She reports that she was helping her daughters move over the weekend and that is what probably triggered this event as she was doing okay.  She denies having any extensive radiation into the legs, but some into left side more than right.  Denies having any signs or symptoms of cauda equina syndrome..  Does have limited range of motion.  Denies having any fever, chills, abdominal pain, signs or symptoms of urinary tract infection, chest pain or shortness of breath.   Today patient denies signs and symptoms of COVID 19 infection including fever, chills, cough, shortness of breath, and headache. Past Medical, Surgical, Social History, Allergies, and Medications have been Reviewed.   Past Medical History:  Diagnosis Date  . Hyperhidrosis of axilla 09/06/2016  . Hyperlipidemia   . Murmur     Current Meds  Medication Sig  . acetaminophen (TYLENOL) 500 MG tablet Take 1,000 mg by mouth every 6 (six) hours as needed.  Marland Kitchen atorvastatin (LIPITOR) 10 MG tablet Take 1 tablet (10 mg total) by mouth daily.  . cetirizine (ZYRTEC) 10 MG tablet Take 10 mg by mouth daily as needed for allergies.  . Cholecalciferol (VITAMIN D) 50 MCG (2000 UT) CAPS Take 2,000 Units by mouth daily.  Marland Kitchen escitalopram (LEXAPRO) 10 MG tablet Take 1 tablet (10 mg total) by mouth daily.  . meloxicam (MOBIC) 7.5 MG tablet Take 1  tablet (7.5 mg total) by mouth daily.  . Multiple Vitamin (MULTIVITAMIN WITH MINERALS) TABS tablet Take 1 tablet by mouth daily.  . Vitamin D, Ergocalciferol, (DRISDOL) 1.25 MG (50000 UNIT) CAPS capsule TAKE 1 CAPSULE BY MOUTH 1 TIME A WEEK  . [DISCONTINUED] pantoprazole (PROTONIX) 20 MG tablet Take 1 tablet (20 mg total) by mouth daily.  . [DISCONTINUED] tiZANidine (ZANAFLEX) 4 MG tablet Take 1 tablet (4 mg total) by mouth at bedtime.    ROS:  Review of Systems  Constitutional: Negative.   HENT: Negative.   Eyes: Negative.   Respiratory: Negative.   Cardiovascular: Negative.   Gastrointestinal: Negative.   Genitourinary: Negative.   Musculoskeletal: Negative.   Skin: Negative.   Neurological: Negative.   Endo/Heme/Allergies: Negative.   Psychiatric/Behavioral: Negative.   All other systems reviewed and are negative.    Objective:   Today's Vitals: BP 138/82 (BP Location: Right Arm, Patient Position: Sitting, Cuff Size: Normal)   Pulse 76   Temp (!) 97.1 F (36.2 C) (Temporal)   Ht 5\' 7"  (1.702 m)   Wt 183 lb 1.9 oz (83.1 kg)   SpO2 97%   BMI 28.68 kg/m  Vitals with BMI 03/17/2020 12/31/2019 12/30/2019  Height 5\' 7"  5\' 7"  5\' 7"   Weight 183 lbs 2 oz 187 lbs 184 lbs  BMI 28.67 29.28 28.81  Systolic [id number]  Diastolic 82 86 79  Pulse 76 67 77     Physical Exam Vitals and nursing note reviewed.  Constitutional:      Appearance: Normal appearance. She is normal weight.  HENT:     Head: Normocephalic and atraumatic.     Right Ear: Tympanic membrane and external ear normal.     Left Ear: Tympanic membrane and external ear normal.     Nose: Nose normal.     Mouth/Throat:     Mouth: Mucous membranes are moist.     Pharynx: Oropharynx is clear.  Eyes:     General:        Right eye: No discharge.        Left eye: No discharge.     Extraocular Movements: Extraocular movements intact.     Conjunctiva/sclera: Conjunctivae normal.     Pupils: Pupils are equal, round,  and reactive to light.  Cardiovascular:     Rate and Rhythm: Normal rate and regular rhythm.     Heart sounds: Normal heart sounds.  Pulmonary:     Effort: Pulmonary effort is normal.     Breath sounds: Normal breath sounds.  Abdominal:     General: Abdomen is flat. Bowel sounds are normal.     Palpations: Abdomen is soft.  Musculoskeletal:     Cervical back: Normal range of motion and neck supple.     Lumbar back: Tenderness and bony tenderness present. Decreased range of motion.     Right lower leg: No edema.     Left lower leg: No edema.  Skin:    General: Skin is warm and dry.     Capillary Refill: Capillary refill takes less than 2 seconds.  Neurological:     General: No focal deficit present.     Mental Status: She is alert and oriented to person, place, and time. Mental status is at baseline.     Cranial Nerves: Cranial nerves are intact.     Sensory: Sensation is intact.     Motor: Motor function is intact.     Coordination: Coordination is intact.     Gait: Gait is intact.  Psychiatric:        Attention and Perception: Attention and perception normal.        Mood and Affect: Mood and affect normal.        Speech: Speech normal.        Behavior: Behavior normal. Behavior is cooperative.        Thought Content: Thought content normal.        Cognition and Memory: Cognition and memory normal.        Judgment: Judgment normal.      Assessment   1. Acute bilateral low back pain with left-sided sciatica     Tests ordered No orders of the defined types were placed in this encounter.    Plan: Please see assessment and plan per problem list above.   Meds ordered this encounter  Medications  . ketorolac (TORADOL) injection 60 mg  . methylPREDNISolone acetate (DEPO-MEDROL) injection 80 mg    Patient to follow-up in 2.5 months   [name], NP

## 2020-03-17 NOTE — Patient Instructions (Signed)
I appreciate the opportunity to provide you with care for your health and wellness. Today we discussed: back pain   Follow up: move up coming appt out by 2 months  No labs or referrals today  Injections today  Work note from 6/14-6/18/2021   Please continue to practice social distancing to keep you, your family, and our community safe.  If you must go out, please wear a mask and practice good handwashing.  It was a pleasure to see you and I look forward to continuing to work together on your health and well-being. Please do not hesitate to call the office if you need care or have questions about your care.  Have a wonderful day and week. With Gratitude, Cherly Beach, DNP, AGNP-BC

## 2020-03-19 DIAGNOSIS — G8929 Other chronic pain: Secondary | ICD-10-CM | POA: Insufficient documentation

## 2020-03-19 DIAGNOSIS — M5442 Lumbago with sciatica, left side: Secondary | ICD-10-CM | POA: Insufficient documentation

## 2020-03-19 NOTE — Assessment & Plan Note (Signed)
She has provided with injections today of Toradol and steroid today in office.  She is encouraged to focus on physical therapy and mobility.

## 2020-04-01 ENCOUNTER — Encounter: Payer: BC Managed Care – PPO | Admitting: Family Medicine

## 2020-06-02 ENCOUNTER — Ambulatory Visit (INDEPENDENT_AMBULATORY_CARE_PROVIDER_SITE_OTHER): Payer: PRIVATE HEALTH INSURANCE | Admitting: Family Medicine

## 2020-06-02 ENCOUNTER — Encounter: Payer: Self-pay | Admitting: Family Medicine

## 2020-06-02 ENCOUNTER — Other Ambulatory Visit: Payer: Self-pay

## 2020-06-02 VITALS — BP 128/81 | HR 79 | Temp 97.9°F | Resp 18 | Ht 67.0 in | Wt 186.1 lb

## 2020-06-02 DIAGNOSIS — F339 Major depressive disorder, recurrent, unspecified: Secondary | ICD-10-CM | POA: Insufficient documentation

## 2020-06-02 DIAGNOSIS — I10 Essential (primary) hypertension: Secondary | ICD-10-CM

## 2020-06-02 DIAGNOSIS — F320 Major depressive disorder, single episode, mild: Secondary | ICD-10-CM | POA: Diagnosis not present

## 2020-06-02 DIAGNOSIS — Z0001 Encounter for general adult medical examination with abnormal findings: Secondary | ICD-10-CM | POA: Diagnosis not present

## 2020-06-02 DIAGNOSIS — G8929 Other chronic pain: Secondary | ICD-10-CM

## 2020-06-02 DIAGNOSIS — M1712 Unilateral primary osteoarthritis, left knee: Secondary | ICD-10-CM | POA: Diagnosis not present

## 2020-06-02 DIAGNOSIS — Z1231 Encounter for screening mammogram for malignant neoplasm of breast: Secondary | ICD-10-CM | POA: Insufficient documentation

## 2020-06-02 DIAGNOSIS — E0789 Other specified disorders of thyroid: Secondary | ICD-10-CM

## 2020-06-02 DIAGNOSIS — K219 Gastro-esophageal reflux disease without esophagitis: Secondary | ICD-10-CM

## 2020-06-02 DIAGNOSIS — E559 Vitamin D deficiency, unspecified: Secondary | ICD-10-CM

## 2020-06-02 DIAGNOSIS — E663 Overweight: Secondary | ICD-10-CM

## 2020-06-02 DIAGNOSIS — Z23 Encounter for immunization: Secondary | ICD-10-CM | POA: Diagnosis not present

## 2020-06-02 DIAGNOSIS — M5441 Lumbago with sciatica, right side: Secondary | ICD-10-CM

## 2020-06-02 DIAGNOSIS — Z131 Encounter for screening for diabetes mellitus: Secondary | ICD-10-CM | POA: Insufficient documentation

## 2020-06-02 DIAGNOSIS — E785 Hyperlipidemia, unspecified: Secondary | ICD-10-CM

## 2020-06-02 LAB — POCT GLYCOSYLATED HEMOGLOBIN (HGB A1C)
HbA1c POC (<> result, manual entry): 5.4 % (ref 4.0–5.6)
HbA1c, POC (controlled diabetic range): 5.4 % (ref 0.0–7.0)
HbA1c, POC (prediabetic range): 5.4 % — AB (ref 5.7–6.4)
Hemoglobin A1C: 5.4 % (ref 4.0–5.6)

## 2020-06-02 MED ORDER — MELOXICAM 7.5 MG PO TABS: 7.5000 mg | ORAL_TABLET | Freq: Every day | ORAL | 0 refills | Status: DC

## 2020-06-02 MED ORDER — SERTRALINE HCL 25 MG PO TABS: 25.0000 mg | ORAL_TABLET | Freq: Every day | ORAL | 3 refills | Status: DC

## 2020-06-02 NOTE — Assessment & Plan Note (Signed)
PHQ-9 slightly elevated. Reports that she does not think the Lexapro is very helpful for her. We will switch her to Zoloft to see if she has a better benefit. Denies having any SI or HI at this time.

## 2020-06-02 NOTE — Assessment & Plan Note (Signed)
Worsening,  Kelly Buchanan is educated about the importance of exercise daily to help with weight management. A minumum of 30 minutes daily is recommended. Additionally, importance of healthy food choices  with portion control discussed.   Wt Readings from Last 3 Encounters:  06/02/20 186 lb 1.9 oz (84.4 kg)  03/17/20 183 lb 1.9 oz (83.1 kg)  12/31/19 187 lb (84.8 kg)

## 2020-06-02 NOTE — Assessment & Plan Note (Signed)
Reports doing well overall not having any issues with this.

## 2020-06-02 NOTE — Progress Notes (Signed)
Health Maintenance reviewed -   Immunization History  Administered Date(s) Administered  . Influenza,inj,Quad PF,6+ Mos 09/06/2016, 06/17/2019, 06/02/2020  . Tdap 09/06/2016   Last Pap smear: needs Last mammogram: ordered today  Last colonoscopy: Due by 37 Last DEXA: Due at 62  Dentist: In [place]- needs appt  Ophtho: Dr Jorja Loa- needs appt  Exercise: a little, some walking 2 times a week  Smoker: none Alcohol Use: none   Other doctors caring for patient include:  Patient Care Team: [name], NP as PCP - General (Family Medicine)  End of Life Discussion:  Patient does not have a living will and medical power of attorney  Subjective:   HPI  Kelly Buchanan is a 49 y.o. female who presents for annual visit/CPE and follow-up on chronic medical conditions.  She has the following concerns:  She reports that she is still not sleeping very well secondary to shift work. Works nights and then is off and then works nights again. Every other week she rotates. She denies any changes in chewing swallowing or appetite or dentition changes. Needs to see a dentist soon. Normally sees them twice yearly. Prior to Covid. Denies having any signs or symptoms or changes in bowel or bladder habits. No blood in urine or stool. Denies having any memory issues. Denies having any falls or injury or trauma. Denies have any skin issues. Denies having any hearing changes. Reports that she has a little bit of trouble with vision she thinks it might just be her dry eye acting up but does need to see if eye doctor soon.  She is in between getting her Covid vaccines. She is willing to get her flu vaccine today.  She will need colonoscopy soon. Needs updated mammogram. Will need Pap soon.  Her biggest complaint today is that she is having some back pain. This is ongoing back pain with some sciatica. Is chronic in nature. She was recently diagnosed with osteoarthritis of the left knee. She is wondering if it  is the way she walks. It is worse in the morning she has to roll out of bed. If she sits for an extended period of time and is worse as well. 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 She reports taking Tylenol and ibuprofen as directed. Has been more mobile but reports that it was very helpful for her. She cannot afford going back to physical therapy at this time. But is open to seeing orthopedics again to see if there is anything that they can recommend.  She also would like to switch from Lexapro to Zoloft to see if there is any ability to increase medication as she reports that her depression is still giving her trouble and not very well controlled on the Lexapro.     Review Of Systems Review of Systems  Constitutional: Negative.   HENT: Negative.   Eyes: Negative.   Respiratory: Negative.   Cardiovascular: Negative.   Gastrointestinal: Negative.   Endocrine: Negative.   Genitourinary: Negative.   Musculoskeletal: Positive for back pain.  Skin: Negative.   Allergic/Immunologic: Negative.   Neurological: Negative.   Hematological: Negative.   Psychiatric/Behavioral: Negative.     Objective:   PHYSICAL EXAM:  BP 128/81 (BP Location: Right Arm, Patient Position: Sitting, Cuff Size: Normal)   Pulse 79   Temp 97.9 F (36.6 C) (Temporal)   Resp 18  Ht 5\' 7"  (1.702 m)   Wt 186 lb 1.9 oz (84.4 kg)   SpO2 98%   BMI 29.15 kg/m   Physical Exam Vitals and nursing note reviewed.  Constitutional:      Appearance: Normal appearance. She is well-developed, well-groomed and overweight.  HENT:     Head: Normocephalic and atraumatic.     Right Ear: Hearing, tympanic membrane, ear canal and external ear normal.     Left Ear: Hearing, tympanic membrane, ear canal and external ear normal.     Nose: Nose normal.     Mouth/Throat:     Lips: Pink.     Mouth: Mucous membranes are moist.     Pharynx: Oropharynx is clear.  Uvula midline.  Eyes:     General: Lids are normal.     Extraocular Movements: Extraocular movements intact.     Conjunctiva/sclera: Conjunctivae normal.     Pupils: Pupils are equal, round, and reactive to light.  Neck:     Thyroid: No thyroid mass or thyroid tenderness.     Vascular: No carotid bruit.     Comments: Fullness in lowerneck/ thyroid area Cardiovascular:     Rate and Rhythm: Normal rate and regular rhythm.     Pulses: Normal pulses.          Radial pulses are 2+ on the right side and 2+ on the left side.       Dorsalis pedis pulses are 2+ on the right side and 2+ on the left side.       Posterior tibial pulses are 2+ on the right side and 2+ on the left side.     Heart sounds: Normal heart sounds.  Pulmonary:     Effort: Pulmonary effort is normal.     Breath sounds: Normal breath sounds and air entry.  Abdominal:     General: Abdomen is flat. Bowel sounds are normal.     Palpations: Abdomen is soft.     Tenderness: There is no abdominal tenderness. There is no right CVA tenderness or left CVA tenderness.     Hernia: No hernia is present.  Musculoskeletal:        General: Normal range of motion.     Cervical back: Normal range of motion and neck supple.     Right lower leg: No edema.     Left lower leg: No edema.     Comments: MAE, ROM intact Slightly slower ROM in low back  Feet:     Right foot:     Skin integrity: Skin integrity normal.     Left foot:     Skin integrity: Skin integrity normal.  Lymphadenopathy:     Cervical: No cervical adenopathy.  Skin:    General: Skin is warm and dry.     Capillary Refill: Capillary refill takes less than 2 seconds.  Neurological:     General: No focal deficit present.     Mental Status: She is alert and oriented to person, place, and time. Mental status is at baseline.     Cranial Nerves: Cranial nerves are intact.     Sensory: Sensation is intact.     Motor: Motor function is intact.     Coordination: Coordination  is intact.     Gait: Gait is intact.     Deep Tendon Reflexes: Reflexes are normal and symmetric.  Psychiatric:        Attention and Perception: Attention and perception normal.  Mood and Affect: Mood and affect normal.        Speech: Speech normal.        Behavior: Behavior normal. Behavior is cooperative.        Thought Content: Thought content normal.        Cognition and Memory: Cognition and memory normal.        Judgment: Judgment normal.     Comments: Good communication and eye contact       Depression Screening  Depression screen [facility] 2/9 06/02/2020 03/17/2020 12/03/2019 10/11/2019 09/16/2019  Decreased Interest 0 0 0 0 0  Down, Depressed, Hopeless 0 0 0 0 0  PHQ - 2 Score 0 0 0 0 0  Altered sleeping 2 3 - - -  Tired, decreased energy 2 3 - - -  Change in appetite 0 1 - - -  Feeling bad or failure about yourself  0 0 - - -  Trouble concentrating 0 0 - - -  Moving slowly or fidgety/restless 0 0 - - -  Suicidal thoughts 0 0 - - -  PHQ-9 Score 4 7 - - -  Difficult doing work/chores Not difficult at all Somewhat difficult - - -     GAD 7 : Generalized Anxiety Score 06/02/2020 03/17/2020  Nervous, Anxious, on Edge 3 0  Control/stop worrying 0 0  Worry too much - different things 0 0  Trouble relaxing 0 0  Restless 0 3  Easily annoyed or irritable 0 0  Afraid - awful might happen 0 0  Total GAD 7 Score 3 3  Anxiety Difficulty Not difficult at all Somewhat difficult    Falls  Fall Risk  06/02/2020 03/17/2020 12/03/2019 10/11/2019 09/16/2019  Falls in the past year? 0 0 0 0 0  Number falls in past yr: 0 0 0 0 0  Injury with Fall? 0 0 0 0 0  Risk for fall due to : No Fall Risks No Fall Risks - - -  Follow up Falls evaluation completed Falls evaluation completed - Falls evaluation completed -    Assessment & Plan:   1. Annual visit for general adult medical examination with abnormal findings   2. Depression, major, single episode, mild ([place])   3. Primary osteoarthritis  of left knee   4. Essential hypertension   5. Gastroesophageal reflux disease, unspecified whether esophagitis present   6. Chronic bilateral low back pain with right-sided sciatica   7. Hyperlipidemia, unspecified hyperlipidemia type   8. Vitamin D deficiency   9. Screening for diabetes mellitus   10. Screening mammogram, encounter for   11. Need for immunization against influenza   12. Overweight (BMI 25.0-29.9)     Tests ordered Orders Placed This Encounter  Procedures  . MM Digital Screening  . Flu Vaccine QUAD 36+ mos IM  . CBC  . Comprehensive metabolic panel  . Lipid panel  . VITAMIN D 25 Hydroxy (Vit-D Deficiency, Fractures)  . TSH  . Ambulatory referral to Orthopedic Surgery  . POCT glycosylated hemoglobin (Hb A1C)     Plan: Please see assessment and plan per problem list above.   Meds ordered this encounter  Medications  . sertraline (ZOLOFT) 25 MG tablet    Sig: Take 1 tablet (25 mg total) by mouth daily.    Dispense:  30 tablet    Refill:  3    Order Specific Question:   Supervising Provider    Answer:   SIMPSON, MARGARET E [[id number]]  . meloxicam (MOBIC)  7.5 MG tablet    Sig: Take 1 tablet (7.5 mg total) by mouth daily.    Dispense:  30 tablet    Refill:  0    Order Specific Question:   Supervising Provider    Answer:   Tula Nakayama E [[id number]]    I have personally reviewed: The patient's medical and social history Their use of alcohol, tobacco or illicit drugs Their current medications and supplements The patient's functional ability including ADLs,fall risks, home safety risks, cognitive, and hearing and visual impairment Diet and physical activities Evidence for depression or mood disorders  The patient's weight, height, BMI, and visual acuity have been recorded in the chart.  I have made referrals, counseling, and provided education to the patient based on review of the above and I have provided the patient with a written personalized care plan  for preventive services.    Note: This dictation was prepared with Dragon dictation along with smaller phrase technology. Similar sounding words can be transcribed inadequately or may not be corrected upon review. Any transcriptional errors that result from this process are unintentional.      [name], NP   06/02/2020

## 2020-06-02 NOTE — Assessment & Plan Note (Signed)
Vitamin D ordered to see if she needs any follow-up dosing. Overall reports energy levels are stable outside of her shift sleep trouble.

## 2020-06-02 NOTE — Assessment & Plan Note (Signed)
Continue pain trouble. Reports that now not just to the right side but left side is involved. Referral back to orthopedics to see if there is anything else that they can recommend possible MRI needs at she reported that physical therapy only help minimal when she is having trouble getting out of bed now. Questionable osteoarthritis. Differential could be ankylosing spondylosis.

## 2020-06-02 NOTE — Assessment & Plan Note (Signed)
Discussed monthly self breast exams and yearly mammograms; at least 30 minutes of aerobic activity at least 5 days/week and weight-bearing exercise 2x/week; proper sunscreen use reviewed; healthy diet, including goals of calcium and vitamin D intake and alcohol recommendations (less than or equal to 1 drink/day) reviewed; regular seatbelt use; changing batteries in smoke detectors.  Immunization recommendations discussed.  Colonoscopy recommendations reviewed.  

## 2020-06-02 NOTE — Assessment & Plan Note (Signed)
Updated labs were ordered. She is on her Lipitor at this time. Hopefully labs will demonstrate that she has had getting better control of her cholesterol. Heart healthy diet is encouraged as well as exercise.

## 2020-06-02 NOTE — Assessment & Plan Note (Signed)
Kelly Buchanan is encouraged to maintain a well balanced diet that is low in salt. Controlled, continue current medication regimen.   Additionally, she is also reminded that exercise is beneficial for heart health and control of  Blood pressure. 30-60 minutes daily is recommended-walking was suggested.

## 2020-06-02 NOTE — Assessment & Plan Note (Signed)

## 2020-06-02 NOTE — Addendum Note (Signed)
Addended by: Zacarias Pontes R on: 06/02/2020 04:29 PM   Modules accepted: Orders

## 2020-06-02 NOTE — Assessment & Plan Note (Signed)
Continue knee discomfort but overall is controlled

## 2020-06-02 NOTE — Patient Instructions (Addendum)
I appreciate the opportunity to provide you with care for your health and wellness. Today we discussed:   Follow up: 6 months for back, sooner if needed  Labs-fasting at [facility] this week  Referrals today: Ortho for back  [employer] your back pain can be resolved soon.  Stay safe!  Please continue to practice social distancing to keep you, your family, and our community safe.  If you must go out, please wear a mask and practice good handwashing.  It was a pleasure to see you and I look forward to continuing to work together on your health and well-being. Please do not hesitate to call the office if you need care or have questions about your care.  Have a wonderful day and week. With Gratitude, Cherly Beach, DNP, AGNP-BC  HEALTH MAINTENANCE RECOMMENDATIONS:  It is recommended that you get at least 30 minutes of aerobic exercise at least 5 days/week (for weight loss, you may need as much as 60-90 minutes). This can be any activity that gets your heart rate up. This can be divided in 10-15 minute intervals if needed, but try and build up your endurance at least once a week.  Weight bearing exercise is also recommended twice weekly.  Eat a healthy diet with lots of vegetables, fruits and fiber.  "Colorful" foods have a lot of vitamins (ie green vegetables, tomatoes, red peppers, etc).  Limit sweet tea, regular sodas and alcoholic beverages, all of which has a lot of calories and sugar.  Up to 1 alcoholic drink daily may be beneficial for women (unless trying to lose weight, watch sugars).  Drink a lot of water.  Calcium recommendations are 1200-1500 mg daily (1500 mg for postmenopausal women or women without ovaries), and vitamin D 1000 IU daily.  This should be obtained from diet and/or supplements (vitamins), and calcium should not be taken all at once, but in divided doses.  Monthly self breast exams and yearly mammograms for women over the age of 38 is recommended.  Sunscreen of at least  SPF 30 should be used on all sun-exposed parts of the skin when outside between the hours of 10 am and 4 pm (not just when at beach or pool, but even with exercise, golf, tennis, and yard work!)  Use a sunscreen that says "broad spectrum" so it covers both UVA and UVB rays, and make sure to reapply every 1-2 hours.  Remember to change the batteries in your smoke detectors when changing your clock times in the spring and fall.  Use your seat belt every time you are in a car, and please drive safely and not be distracted with cell phones and texting while driving.

## 2020-06-10 ENCOUNTER — Other Ambulatory Visit: Payer: Self-pay

## 2020-06-10 ENCOUNTER — Ambulatory Visit (HOSPITAL_COMMUNITY)
Admission: RE | Admit: 2020-06-10 | Discharge: 2020-06-10 | Disposition: A | Discharge status: Home / Self Care | Payer: PRIVATE HEALTH INSURANCE | Source: Ambulatory Visit | Attending: Family Medicine | Admitting: Family Medicine

## 2020-06-10 DIAGNOSIS — E0789 Other specified disorders of thyroid: Secondary | ICD-10-CM | POA: Diagnosis not present

## 2020-06-18 ENCOUNTER — Encounter: Payer: Self-pay | Admitting: Radiology

## 2020-07-24 ENCOUNTER — Other Ambulatory Visit: Payer: Self-pay

## 2020-07-24 ENCOUNTER — Encounter: Payer: Self-pay | Admitting: Family Medicine

## 2020-07-24 ENCOUNTER — Ambulatory Visit (INDEPENDENT_AMBULATORY_CARE_PROVIDER_SITE_OTHER): Payer: PRIVATE HEALTH INSURANCE | Admitting: Family Medicine

## 2020-07-24 VITALS — BP 128/70 | HR 74 | Temp 97.0°F | Ht 67.0 in | Wt 185.0 lb

## 2020-07-24 DIAGNOSIS — K30 Functional dyspepsia: Secondary | ICD-10-CM | POA: Diagnosis not present

## 2020-07-24 DIAGNOSIS — R21 Rash and other nonspecific skin eruption: Secondary | ICD-10-CM | POA: Insufficient documentation

## 2020-07-24 MED ORDER — PREDNISONE 10 MG (21) PO TBPK: ORAL_TABLET | ORAL | 0 refills | Status: DC

## 2020-07-24 MED ORDER — FAMOTIDINE 20 MG PO TABS: 20.0000 mg | ORAL_TABLET | Freq: Every day | ORAL | 2 refills | Status: DC

## 2020-07-24 NOTE — Assessment & Plan Note (Signed)
Rash head to toe post change in body wash. Itching and redness, no open pustules or areas of questionable concern. Pred pk ordered, as well as OTC antihistamine   Reviewed side effects, risks and benefits of medication.   Patient acknowledged agreement and understanding of the plan.

## 2020-07-24 NOTE — Progress Notes (Signed)
Subjective:  Patient ID: Kelly Buchanan, female    DOB: Jul 29, 1971  Age: 49 y.o. MRN: [id number]  CC:  Chief Complaint  Patient presents with  . Rash    all over x3 days, itching, started after using a new body wash      HPI  Ms Silba is very pleasant 49 year old female patient of mine. She presents for an acute visit about a rash and also mentions coughing post eating.  Rash This is a new problem. The current episode started yesterday. The problem is unchanged. The rash is diffuse. The rash is characterized by itchiness, dryness and redness. She was exposed to a new detergent/soap. Associated symptoms include coughing. Past treatments include nothing. The treatment provided no relief. Her past medical history is significant for allergies.  Gastroesophageal Reflux She complains of coughing. This is a recurrent problem. The current episode started more than 1 month ago. The problem occurs frequently. The problem has been unchanged. The symptoms are aggravated by certain foods and caffeine. Risk factors include caffeine use and NSAIDs. She has tried nothing for the symptoms. The treatment provided no relief.   She denies chest pain, heart palpitations, leg swelling, shortness of breath, fever or chills or other S&S of infection.  Vaccines up to date  Today patient denies signs and symptoms of COVID 19 infection including fever, chills, cough, shortness of breath, and headache. Past Medical, Surgical, Social History, Allergies, and Medications have been Reviewed.   Past Medical History:  Diagnosis Date  . Chest heaviness 12/31/2019  . Hyperhidrosis of axilla 09/06/2016  . Hyperlipidemia   . Murmur     Current Meds  Medication Sig  . acetaminophen (TYLENOL) 500 MG tablet Take 1,000 mg by mouth every 6 (six) hours as needed.  Marland Kitchen atorvastatin (LIPITOR) 10 MG tablet Take 1 tablet (10 mg total) by mouth daily.  . meloxicam (MOBIC) 7.5 MG tablet Take 1 tablet (7.5 mg total) by  mouth daily.  . Multiple Vitamin (MULTIVITAMIN WITH MINERALS) TABS tablet Take 1 tablet by mouth daily.  . sertraline (ZOLOFT) 25 MG tablet Take 1 tablet (25 mg total) by mouth daily.    ROS:  Review of Systems  Constitutional: Negative.   Eyes: Negative.   Respiratory: Positive for cough.   Cardiovascular: Negative.   Gastrointestinal: Negative.   Genitourinary: Negative.   Musculoskeletal: Negative.   Skin: Positive for rash.  Neurological: Negative.   Endo/Heme/Allergies: Negative.   Psychiatric/Behavioral: Negative.      Objective:   Today's Vitals: BP 128/70 (BP Location: Right Arm, Patient Position: Sitting, Cuff Size: Normal)   Pulse 74   Temp (!) 97 F (36.1 C) (Tympanic)   Ht 5\' 7"  (1.702 m)   Wt 185 lb (83.9 kg)   LMP 07/10/2020   SpO2 98%   BMI 28.98 kg/m  Vitals with BMI 07/24/2020 06/02/2020 03/17/2020  Height 5\' 7"  5\' 7"  5\' 7"   Weight 185 lbs 186 lbs 2 oz 183 lbs 2 oz  BMI 28.97 70.35 00.93  Systolic 818 299 371  Diastolic 70 81 82  Pulse 74 79 76     Physical Exam Vitals and nursing note reviewed.  Constitutional:      Appearance: Normal appearance. She is well-developed, well-groomed and overweight.  HENT:     Head: Normocephalic and atraumatic.     Right Ear: External ear normal.     Left Ear: External ear normal.     Mouth/Throat:     Comments:  Mask in place  Eyes:     General:        Right eye: No discharge.        Left eye: No discharge.     Conjunctiva/sclera: Conjunctivae normal.  Cardiovascular:     Rate and Rhythm: Normal rate and regular rhythm.     Pulses: Normal pulses.     Heart sounds: Normal heart sounds.  Pulmonary:     Effort: Pulmonary effort is normal.     Breath sounds: Normal breath sounds.  Musculoskeletal:        General: Normal range of motion.     Cervical back: Normal range of motion and neck supple.  Skin:    General: Skin is warm.     Findings: Rash present.     Comments: Rash over arms and legs and trunk  of body-redness, flat, non- pustular  Neurological:     General: No focal deficit present.     Mental Status: She is alert and oriented to person, place, and time.  Psychiatric:        Attention and Perception: Attention normal.        Mood and Affect: Mood normal.        Speech: Speech normal.        Behavior: Behavior normal. Behavior is cooperative.        Thought Content: Thought content normal.        Cognition and Memory: Cognition normal.        Judgment: Judgment normal.     Assessment   1. Rash of body   2. Indigestion     Tests ordered No orders of the defined types were placed in this encounter.    Plan: Please see assessment and plan per problem list above.   Meds ordered this encounter  Medications  . predniSONE (STERAPRED UNI-PAK 21 TAB) 10 MG (21) TBPK tablet    Sig: Take a directed    Dispense:  21 tablet    Refill:  0    Order Specific Question:   Supervising Provider    Answer:   SIMPSON, MARGARET E [[id number]]  . famotidine (PEPCID) 20 MG tablet    Sig: Take 1 tablet (20 mg total) by mouth daily. 30 mins to 1 hour prior to eating or drinking anything    Dispense:  30 tablet    Refill:  2    Order Specific Question:   Supervising Provider    Answer:   Fayrene Helper [[id number]]    Patient to follow-up in as scheduled .  Note: This dictation was prepared with Dragon dictation along with smaller phrase technology. Similar sounding words can be transcribed inadequately or may not be corrected upon review. Any transcriptional errors that result from this process are unintentional.      [name], NP

## 2020-07-24 NOTE — Patient Instructions (Signed)
  H[place]Y FALL!  I appreciate the opportunity to provide you with care for your health and wellness. Today we discussed: rash   Follow up:  10/01/2020 as scheduled  No labs or referrals today  [place]!  Prednisone dose pk to take for rash  Pepcid to take to see if reflux causing cough after you eat Take 30-1 hour prior to eating or drinking (usually first thing in the morning)  Please continue to practice social distancing to keep you, your family, and our community safe.  If you must go out, please wear a mask and practice good handwashing.  It was a pleasure to see you and I look forward to continuing to work together on your health and well-being. Please do not hesitate to call the office if you need care or have questions about your care.  Have a wonderful day and week. With Gratitude, Cherly Beach, DNP, AGNP-BC

## 2020-07-24 NOTE — Assessment & Plan Note (Addendum)
Cough present post eating- has not tried OTC at this time, was unsure of what could cause it. She is willing to try Pepcid short term to see if that helps. Would like referral for allergy testing in future if not improved with reflux treatment measures.  Reviewed side effects, risks and benefits of medication.   Patient acknowledged agreement and understanding of the plan.

## 2020-09-27 ENCOUNTER — Telehealth: Payer: PRIVATE HEALTH INSURANCE | Admitting: Family

## 2020-09-27 DIAGNOSIS — R059 Cough, unspecified: Secondary | ICD-10-CM

## 2020-09-27 NOTE — Progress Notes (Signed)
We are sorry that you are not feeling well.  Here is how we plan to help!  Based on your presentation I believe you most likely have A cough due to a virus.  This is called viral bronchitis and is best treated by rest, plenty of fluids and control of the cough.  You may use Ibuprofen or Tylenol as directed to help your symptoms.     In addition you may use A prescription cough medication called Tessalon Perles 100mg. You may take 1-2 capsules every 8 hours as needed for your cough.  Prednisone 10 mg daily for 6 days (see taper instructions below)  Directions for 6 day taper: Day 1: 2 tablets before breakfast, 1 after both lunch & dinner and 2 at bedtime Day 2: 1 tab before breakfast, 1 after both lunch & dinner and 2 at bedtime Day 3: 1 tab at each meal & 1 at bedtime Day 4: 1 tab at breakfast, 1 at lunch, 1 at bedtime Day 5: 1 tab at breakfast & 1 tab at bedtime Day 6: 1 tab at breakfast   From your responses in the eVisit questionnaire you describe inflammation in the upper respiratory tract which is causing a significant cough.  This is commonly called Bronchitis and has four common causes:    Allergies  Viral Infections  Acid Reflux  Bacterial Infection Allergies, viruses and acid reflux are treated by controlling symptoms or eliminating the cause. An example might be a cough caused by taking certain blood pressure medications. You stop the cough by changing the medication. Another example might be a cough caused by acid reflux. Controlling the reflux helps control the cough.  USE OF BRONCHODILATOR ("RESCUE") INHALERS: There is a risk from using your bronchodilator too frequently.  The risk is that over-reliance on a medication which only relaxes the muscles surrounding the breathing tubes can reduce the effectiveness of medications prescribed to reduce swelling and congestion of the tubes themselves.  Although you feel brief relief from the bronchodilator inhaler, your asthma may  actually be worsening with the tubes becoming more swollen and filled with mucus.  This can delay other crucial treatments, such as oral steroid medications. If you need to use a bronchodilator inhaler daily, several times per day, you should discuss this with your provider.  There are probably better treatments that could be used to keep your asthma under control.     HOME CARE . Only take medications as instructed by your medical team. . Complete the entire course of an antibiotic. . Drink plenty of fluids and get plenty of rest. . Avoid close contacts especially the very young and the elderly . Cover your mouth if you cough or cough into your sleeve. . Always remember to wash your hands . A steam or ultrasonic humidifier can help congestion.   GET HELP RIGHT AWAY IF: . You develop worsening fever. . You become short of breath . You cough up blood. . Your symptoms persist after you have completed your treatment plan MAKE SURE YOU   Understand these instructions.  Will watch your condition.  Will get help right away if you are not doing well or get worse.  Your e-visit answers were reviewed by a board certified advanced clinical practitioner to complete your personal care plan.  Depending on the condition, your plan could have included both over the counter or prescription medications. If there is a problem please reply  once you have received a response from your provider. Your   safety is important to us.  If you have drug allergies check your prescription carefully.    You can use MyChart to ask questions about today's visit, request a non-urgent call back, or ask for a work or school excuse for 24 hours related to this e-Visit. If it has been greater than 24 hours you will need to follow up with your provider, or enter a new e-Visit to address those concerns. You will get an e-mail in the next two days asking about your experience.  I hope that your e-visit has been valuable and will  speed your recovery. Thank you for using e-visits.  Greater than 5 minutes, yet less than 10 minutes of time have been spent researching, coordinating, and implementing care for this patient today.  Thank you for the details you included in the comment boxes. Those details are very helpful in determining the best course of treatment for you and help us to provide the best care.  

## 2020-09-30 ENCOUNTER — Encounter: Payer: Self-pay | Admitting: Family Medicine

## 2020-09-30 ENCOUNTER — Other Ambulatory Visit: Payer: Self-pay

## 2020-09-30 ENCOUNTER — Telehealth (INDEPENDENT_AMBULATORY_CARE_PROVIDER_SITE_OTHER): Payer: PRIVATE HEALTH INSURANCE | Admitting: Family Medicine

## 2020-09-30 VITALS — Ht 67.0 in | Wt 181.0 lb

## 2020-09-30 DIAGNOSIS — J40 Bronchitis, not specified as acute or chronic: Secondary | ICD-10-CM | POA: Diagnosis not present

## 2020-09-30 DIAGNOSIS — R059 Cough, unspecified: Secondary | ICD-10-CM | POA: Insufficient documentation

## 2020-09-30 DIAGNOSIS — R0602 Shortness of breath: Secondary | ICD-10-CM

## 2020-09-30 MED ORDER — PREDNISONE 10 MG (21) PO TBPK: ORAL_TABLET | ORAL | 0 refills | Status: DC

## 2020-09-30 MED ORDER — ALBUTEROL SULFATE HFA 108 (90 BASE) MCG/ACT IN AERS: 1.0000 | INHALATION_SPRAY | Freq: Four times a day (QID) | RESPIRATORY_TRACT | 0 refills | Status: DC | PRN

## 2020-09-30 MED ORDER — BENZONATATE 100 MG PO CAPS: 100.0000 mg | ORAL_CAPSULE | Freq: Three times a day (TID) | ORAL | 1 refills | Status: DC | PRN

## 2020-09-30 NOTE — Progress Notes (Signed)
Virtual Visit via Telephone Note   This visit type was conducted due to national recommendations for restrictions regarding the COVID-19 Pandemic (e.g. social distancing) in an effort to limit this patient's exposure and mitigate transmission in our community.  Due to her co-morbid illnesses, this patient is at least at moderate risk for complications without adequate follow up.  This format is felt to be most appropriate for this patient at this time.  The patient did not have access to video technology/had technical difficulties with video requiring transitioning to audio format only (telephone).  All issues noted in this document were discussed and addressed.  No physical exam could be performed with this format.    Evaluation Performed:  Follow-up visit  Date:  09/30/2020   ID:  WAYNISHA DIEUDONNE, DOB 11/27/1970, MRN [id number]  Patient Location: Home Provider Location: Office/Clinic  Participants: Nurse intake and work up; Patient and Provider for Visit and Wrap up  Method of visit: Telephone  Location of Patient: Home Location of Provider: Office Consent was obtain for visit over the telephone. Services rendered by provider: Visit was performed via telephone  I verified that I am speaking with the correct person using two identifiers.  PCP:  [name], NP   Chief Complaint:  Cough and fever   History of Present Illness:    KAMECIA DEBOCK is a 49 y.o. female with history as stated below.  Recently has been around somebody who tested positive for the Covid virus.  She has been Covid vaccinated with 2 part series has not had her booster had to cancel it secondary to getting sick.  She has a cough that started back on 24 December.  She felt that she was having a cold come on and that was why she started having a cough.  She ended up doing an E visit was told to take prednisone but reports that it was never sent in.  She reports the cough comes in spasms at times.  It has  progressed over the last several days.  It has not prevented her from sleeping at this time.  She does feel that she has a headache and some blocked sinuses.  She has had a fever up to 101 degrees.  And has had night sweats.  She is coughing up a little mucus but not much at all.  She does not use a maintenance inhaler but does report having some shortness of breath especially when she is just walking around.  She denies having any excessive need to go to the emergency room at this time.  Denies having any chest pain, palpitations or leg swelling or cough with shortness of breath that takes her breath away. She has some fatigue that is increased, the hot flashes with body aches.  Denies having any nausea or vomiting. Has lost taste and smell and was tested yesterday. Waiting on results of covid test she took yesterday.  The patient does not have symptoms concerning for COVID-19 infection (fever, chills, cough, or new shortness of breath).   Past Medical, Surgical, Social History, Allergies, and Medications have been Reviewed.  Past Medical History:  Diagnosis Date  . Chest heaviness 12/31/2019  . Hyperhidrosis of axilla 09/06/2016  . Hyperlipidemia   . Murmur    Past Surgical History:  Procedure Laterality Date  . APPENDECTOMY  1980's  . TONSILLECTOMY  1999  . TUBAL LIGATION  1997     Current Meds  Medication Sig  . acetaminophen (  TYLENOL) 500 MG tablet Take 1,000 mg by mouth every 6 (six) hours as needed.  Marland Kitchen albuterol (VENTOLIN HFA) 108 (90 Base) MCG/ACT inhaler Inhale 1-2 puffs into the lungs every 6 (six) hours as needed for wheezing or shortness of breath.  Marland Kitchen atorvastatin (LIPITOR) 10 MG tablet Take 1 tablet (10 mg total) by mouth daily.  . benzonatate (TESSALON) 100 MG capsule Take 1 capsule (100 mg total) by mouth 3 (three) times daily as needed for cough.  . famotidine (PEPCID) 20 MG tablet Take 1 tablet (20 mg total) by mouth daily. 30 mins to 1 hour prior to eating or drinking  anything  . meloxicam (MOBIC) 7.5 MG tablet Take 1 tablet (7.5 mg total) by mouth daily.  . Multiple Vitamin (MULTIVITAMIN WITH MINERALS) TABS tablet Take 1 tablet by mouth daily.  . predniSONE (STERAPRED UNI-PAK 21 TAB) 10 MG (21) TBPK tablet Take as directed  . sertraline (ZOLOFT) 25 MG tablet Take 1 tablet (25 mg total) by mouth daily.     Allergies:   Patient has no known allergies.   ROS:   Please see the history of present illness.    All other systems reviewed and are negative.   Labs/Other Tests and Data Reviewed:    Recent Labs: 12/03/2019: BUN 10; Creat 0.77; Hemoglobin 12.9; Platelets 344; Potassium 4.0; Sodium 137   Recent Lipid Panel Lab Results  Component Value Date/Time   CHOL 236 (H) 12/03/2019 11:45 AM   TRIG 99 12/03/2019 11:45 AM   HDL 52 12/03/2019 11:45 AM   CHOLHDL 4.5 12/03/2019 11:45 AM   LDLCALC 163 (H) 12/03/2019 11:45 AM    Wt Readings from Last 3 Encounters:  09/30/20 181 lb (82.1 kg)  07/24/20 185 lb (83.9 kg)  06/02/20 186 lb 1.9 oz (84.4 kg)     Objective:    Vital Signs:  Ht 5\' 7"  (1.702 m)   Wt 181 lb (82.1 kg)   BMI 28.35 kg/m    VITAL SIGNS:  reviewed GEN:  no acute distress RESPIRATORY:  no shortness of breath noted in conversation PSYCH:  normal affect  ASSESSMENT & PLAN:    1. Bronchitis  - benzonatate (TESSALON) 100 MG capsule; Take 1 capsule (100 mg total) by mouth 3 (three) times daily as needed for cough.  Dispense: 30 capsule; Refill: 1 - predniSONE (STERAPRED UNI-PAK 21 TAB) 10 MG (21) TBPK tablet; Take as directed  Dispense: 21 tablet; Refill: 0 - albuterol (VENTOLIN HFA) 108 (90 Base) MCG/ACT inhaler; Inhale 1-2 puffs into the lungs every 6 (six) hours as needed for wheezing or shortness of breath.  Dispense: 18 g; Refill: 0  2. Cough  - benzonatate (TESSALON) 100 MG capsule; Take 1 capsule (100 mg total) by mouth 3 (three) times daily as needed for cough.  Dispense: 30 capsule; Refill: 1 - albuterol (VENTOLIN HFA)  108 (90 Base) MCG/ACT inhaler; Inhale 1-2 puffs into the lungs every 6 (six) hours as needed for wheezing or shortness of breath.  Dispense: 18 g; Refill: 0  3. Shortness of breath  - benzonatate (TESSALON) 100 MG capsule; Take 1 capsule (100 mg total) by mouth 3 (three) times daily as needed for cough.  Dispense: 30 capsule; Refill: 1 - predniSONE (STERAPRED UNI-PAK 21 TAB) 10 MG (21) TBPK tablet; Take as directed  Dispense: 21 tablet; Refill: 0 - albuterol (VENTOLIN HFA) 108 (90 Base) MCG/ACT inhaler; Inhale 1-2 puffs into the lungs every 6 (six) hours as needed for wheezing or shortness of breath.  Dispense: 18 g; Refill: 0   Time:   Today, I have spent 7 minutes with the patient with telehealth technology discussing the above problems.     Medication Adjustments/Labs and Tests Ordered: Current medicines are reviewed at length with the patient today.  Concerns regarding medicines are outlined above.   Tests Ordered: No orders of the defined types were placed in this encounter.   Medication Changes: Meds ordered this encounter  Medications  . benzonatate (TESSALON) 100 MG capsule    Sig: Take 1 capsule (100 mg total) by mouth 3 (three) times daily as needed for cough.    Dispense:  30 capsule    Refill:  1    Order Specific Question:   Supervising Provider    Answer:   SIMPSON, MARGARET E [[id number]]  . predniSONE (STERAPRED UNI-PAK 21 TAB) 10 MG (21) TBPK tablet    Sig: Take as directed    Dispense:  21 tablet    Refill:  0    Order Specific Question:   Supervising Provider    Answer:   SIMPSON, MARGARET E [[id number]]  . albuterol (VENTOLIN HFA) 108 (90 Base) MCG/ACT inhaler    Sig: Inhale 1-2 puffs into the lungs every 6 (six) hours as needed for wheezing or shortness of breath.    Dispense:  18 g    Refill:  0    Order Specific Question:   Supervising Provider    Answer:   Kerri Perches [[id number]]     Disposition:  Follow up as scheduled Signed, Freddy Finner, NP   09/30/2020 10:07 AM     [facility]

## 2020-09-30 NOTE — Assessment & Plan Note (Signed)
Signs and symptoms are consistent with possible Covid.  Awaiting her results.  Will treat with prednisone, Tessalon Perles, albuterol.  Advised of precautions and when to be seen in person at the urgent care or emergency room.  Verbalized understanding of side effects and precautions.  Also advised to update [place] on her Covid results.

## 2020-09-30 NOTE — Patient Instructions (Signed)
I appreciate the opportunity to provide you with care for your health and wellness. Today we discussed: covid   Follow up: as scheduled   No labs or referrals today  I hope you feel better soon.  Please continue to practice social distancing to keep you, your family, and our community safe.  If you must go out, please wear a mask and practice good handwashing.  It was a pleasure to see you and I look forward to continuing to work together on your health and well-being. Please do not hesitate to call the office if you need care or have questions about your care.  Have a wonderful day. With Gratitude, Tereasa Coop, DNP, AGNP-BC

## 2020-09-30 NOTE — Assessment & Plan Note (Signed)
Prednisone inhaler provided.  In addition to [employer].  Covid-like symptoms awaiting Covid test.  Advised for quarantine and safety measures.  And to notify us with her results come back.  Patient verbalized understanding of side effects and precautions of when to go to the emergency room.

## 2020-09-30 NOTE — Assessment & Plan Note (Signed)
Prednisone inhaler provided.  In addition to Tessalon Perles.  Covid-like symptoms awaiting Covid test.  Advised for quarantine and safety measures.  And to notify us with her results come back.  Patient verbalized understanding of side effects and precautions of when to go to the emergency room. 

## 2020-10-01 ENCOUNTER — Ambulatory Visit: Payer: PRIVATE HEALTH INSURANCE | Admitting: Family Medicine

## 2020-10-01 ENCOUNTER — Telehealth: Payer: Self-pay

## 2020-10-01 NOTE — Telephone Encounter (Signed)
Pt calling back to advise    Covid +

## 2020-10-01 NOTE — Telephone Encounter (Signed)
Noted thank you- lets add this to problem list

## 2020-10-13 ENCOUNTER — Encounter: Payer: Self-pay | Admitting: Family Medicine

## 2020-10-13 ENCOUNTER — Other Ambulatory Visit: Payer: Self-pay

## 2020-10-13 ENCOUNTER — Telehealth (INDEPENDENT_AMBULATORY_CARE_PROVIDER_SITE_OTHER): Payer: PRIVATE HEALTH INSURANCE | Admitting: Family Medicine

## 2020-10-13 VITALS — BP 121/81 | HR 85 | Resp 16

## 2020-10-13 DIAGNOSIS — G933 Postviral fatigue syndrome: Secondary | ICD-10-CM

## 2020-10-13 DIAGNOSIS — I10 Essential (primary) hypertension: Secondary | ICD-10-CM

## 2020-10-13 DIAGNOSIS — R5383 Other fatigue: Secondary | ICD-10-CM

## 2020-10-13 DIAGNOSIS — F320 Major depressive disorder, single episode, mild: Secondary | ICD-10-CM

## 2020-10-13 DIAGNOSIS — G9331 Postviral fatigue syndrome: Secondary | ICD-10-CM

## 2020-10-13 MED ORDER — SERTRALINE HCL 25 MG PO TABS: 25.0000 mg | ORAL_TABLET | Freq: Every day | ORAL | 3 refills | Status: DC

## 2020-10-13 NOTE — Patient Instructions (Addendum)
I appreciate the opportunity to provide you with care for your health and wellness.  Follow up: as needed   Labs -today to rule out other causes of fatigue, most likely related to COVID infection  No referrals today  Work note today  Hoping you will feel better soon.   Please continue to practice social distancing to keep you, your family, and our community safe.  If you must go out, please wear a mask and practice good handwashing.  It was a pleasure to see you and I look forward to continuing to work together on your health and well-being. Please do not hesitate to call the office if you need care or have questions about your care.  Have a wonderful day. With Gratitude, Cherly Beach, DNP, AGNP-BC

## 2020-10-13 NOTE — Telephone Encounter (Signed)
Please provide her with a work note that covers her from 09/29/2020 until this upcoming Monday secondary to having COVID.

## 2020-10-13 NOTE — Progress Notes (Signed)
Virtual Visit via Telephone Note   This visit type was conducted due to national recommendations for restrictions regarding the COVID-19 Pandemic (e.g. social distancing) in an effort to limit this patient's exposure and mitigate transmission in our community.  Due to her co-morbid illnesses, this patient is at least at moderate risk for complications without adequate follow up.  This format is felt to be most appropriate for this patient at this time.  The patient did not have access to video technology/had technical difficulties with video requiring transitioning to audio format only (telephone).  All issues noted in this document were discussed and addressed.  No physical exam could be performed with this format.    Evaluation Performed:  Follow-up visit  Date:  10/13/2020   ID:  Kelly Buchanan, DOB Jun 12, 1971, MRN [id number]  Patient Location: Home Provider Location: Office/Clinic   Participants: Nurse for intake and work up; Patient and Provider for Visit and Wrap up  Method of visit: Telephone  Location of Patient: Home Location of Provider: Office Consent was obtain for visit over the telephone. Services rendered by provider: Visit was performed via telephone.  I verified that I am speaking with the correct person using two identifiers.  PCP:  [name], NP   Chief Complaint: Post COVID fatigue  History of Present Illness:    Kelly Buchanan is a 50 y.o. female with history as stated below.  Most recently had an infection with COVID-19 diagnosed on 12/30 after being tested on 09/29/2020. She reports that she just feels exhausted continuously.  She feels like her "heart is going to be out of her chest" several days in the last couple of weeks.  She reports she checks her blood pressure and pulse at home and both are normal when she feels like that.  She has not gone to the urgent care or ED when she is feeling the sensation.  She is willing to get some updated labs.  And  treatment will be provided as necessary.  She is educated on the impact of COVID-19 and fatigue that can be ongoing.  Additionally she needs a work note.  She denies having any chest pain, cough, shortness of breath, palpitations, leg swelling, dizziness or vision changes or headaches at this time.  The patient does not have symptoms concerning for COVID-19 infection (fever, chills, cough, or new shortness of breath).   Past Medical, Surgical, Social History, Allergies, and Medications have been Reviewed.  Past Medical History:  Diagnosis Date  . Chest heaviness 12/31/2019  . Hyperhidrosis of axilla 09/06/2016  . Hyperlipidemia   . Murmur    Past Surgical History:  Procedure Laterality Date  . APPENDECTOMY  1980's  . TONSILLECTOMY  1999  . TUBAL LIGATION  1997     No outpatient medications have been marked as taking for the 10/13/20 encounter (Video Visit) with [name], NP.     Allergies:   Patient has no known allergies.   ROS:   Please see the history of present illness.    All other systems reviewed and are negative.   Labs/Other Tests and Data Reviewed:    Recent Labs: 12/03/2019: BUN 10; Creat 0.77; Hemoglobin 12.9; Platelets 344; Potassium 4.0; Sodium 137   Recent Lipid Panel Lab Results  Component Value Date/Time   CHOL 236 (H) 12/03/2019 11:45 AM   TRIG 99 12/03/2019 11:45 AM   HDL 52 12/03/2019 11:45 AM   CHOLHDL 4.5 12/03/2019 11:45 AM  LDLCALC 163 (H) 12/03/2019 11:45 AM    Wt Readings from Last 3 Encounters:  09/30/20 181 lb (82.1 kg)  07/24/20 185 lb (83.9 kg)  06/02/20 186 lb 1.9 oz (84.4 kg)     Objective:    Vital Signs:  BP 121/81   Pulse 85   Resp 16   SpO2 97%    VITAL SIGNS:  reviewed GEN:  no acute distress RESPIRATORY:  No shortness of breath noted in conversation PSYCH:  normal affect  ASSESSMENT & PLAN:     1. Postviral fatigue syndrome   Time:   Today, I have spent 10 minutes with the patient with telehealth  technology discussing the above problems.     Medication Adjustments/Labs and Tests Ordered: Current medicines are reviewed at length with the patient today.  Concerns regarding medicines are outlined above.   Tests Ordered: No orders of the defined types were placed in this encounter.   Medication Changes: No orders of the defined types were placed in this encounter.    Disposition:  Follow up as scheduled Signed, [name], NP  10/13/2020 11:13 AM     [place]

## 2020-10-13 NOTE — Assessment & Plan Note (Signed)
74 had improved she has had fatigue in the past.  She did tell a difference when she started being put on vitamin D for vitamin D deficiency.  However she just recently had COVID-19 and she reports that her fatigue amplified after being diagnosed with this.  And she is still having a hard time with it.  We will get some updated labs to rule out other possible causes at this time she is encouraged to get plenty of rest she is provided with a work note and to eat well and hydrate well.

## 2020-10-14 LAB — B12 AND FOLATE PANEL
Folate: >20 ng/mL (ref >3.0)
Vitamin B-12: >2000 pg/mL — ABNORMAL HIGH (ref 232–1245)

## 2020-10-14 LAB — TSH: TSH: 2.28 u[IU]/mL (ref 0.450–4.500)

## 2020-10-29 ENCOUNTER — Encounter: Payer: Self-pay | Admitting: Family Medicine

## 2020-10-30 ENCOUNTER — Other Ambulatory Visit: Payer: Self-pay

## 2020-10-30 ENCOUNTER — Encounter: Payer: Self-pay | Admitting: Nurse Practitioner

## 2020-10-30 ENCOUNTER — Telehealth (INDEPENDENT_AMBULATORY_CARE_PROVIDER_SITE_OTHER): Payer: PRIVATE HEALTH INSURANCE | Admitting: Nurse Practitioner

## 2020-10-30 DIAGNOSIS — R350 Frequency of micturition: Secondary | ICD-10-CM

## 2020-10-30 LAB — POCT URINALYSIS DIPSTICK
Bilirubin, UA: NEGATIVE
Blood, UA: NEGATIVE
Glucose, UA: NEGATIVE
Ketones, UA: NEGATIVE
Leukocytes, UA: NEGATIVE
Nitrite, UA: NEGATIVE
Odor: NEGATIVE
Protein, UA: NEGATIVE
Spec Grav, UA: 1.01 (ref 1.010–1.025)
Urobilinogen, UA: 0.2 E.U./dL
pH, UA: 5.5 (ref 5.0–8.0)

## 2020-10-30 NOTE — Progress Notes (Signed)
Acute Office Visit  Subjective:    Patient ID: Kelly Buchanan, female    DOB: [birth year], 50 y.o.   MRN: [id number]  Chief Complaint  Patient presents with  . Urinary Frequency    X 3 weeks     HPI Patient is in today for urinary frequency.  She state she has this for 3 weeks.  No cloudy urine or odor.  She dropped off urine for U/A and culture.  Past Medical History:  Diagnosis Date  . Chest heaviness 12/31/2019  . Hyperhidrosis of axilla 09/06/2016  . Hyperlipidemia   . Murmur     Past Surgical History:  Procedure Laterality Date  . APPENDECTOMY  1980's  . TONSILLECTOMY  1999  . TUBAL LIGATION  1997    Family History  Problem Relation Age of Onset  . Hypertension Mother   . Cancer Mother 60       rectal CA   . Heart disease Father 50       died at 45  . Heart attack Father   . Hypertension Father   . Alcohol abuse Father   . Cancer Sister        hodgkins  . Hypertension Sister   . Obesity Sister   . Cancer Maternal Aunt     Social History   Socioeconomic History  . Marital status: Single    Spouse name: Not on file  . Number of children: 4  . Years of education: 23  . Highest education level: Some college, no degree  Occupational History  . 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[employer]  Tobacco Use  . Smoking status: Never Smoker  . Smokeless tobacco: Never Used  Substance and Sexual Activity  . Alcohol use: No    Alcohol/week: 0.0 standard drinks  . Drug use: No  . Sexual activity: Not Currently    Birth control/protection: Surgical  Other Topics Concern  . Not on file  Social History Narrative   Lives with Mother   Has four children (girls)   [place] [address] 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 acetaminophen (TYLENOL) 500 MG tablet Take 1,000 mg by mouth every 6 (six) hours as needed.    Marland Kitchen albuterol (VENTOLIN HFA) 108 (90 Base) MCG/ACT inhaler Inhale 1-2 puffs into the lungs every 6 (six) hours as needed for wheezing or shortness of breath. 18 g 0  . atorvastatin (LIPITOR) 10 MG tablet Take 1 tablet (10 mg total) by mouth daily. 90 tablet 1  . benzonatate (TESSALON) 100 MG capsule Take 1 capsule (100 mg total) by mouth 3 (three) times daily as needed for cough. [place]  capsule 1  . famotidine (PEPCID) 20 MG tablet Take 1 tablet (20 mg total) by mouth daily. 30 mins to 1 hour prior to eating or drinking anything 30 tablet 2  . meloxicam (MOBIC) 7.5 MG tablet Take 1 tablet (7.5 mg total) by mouth daily. 30 tablet 0  . Multiple Vitamin (MULTIVITAMIN WITH MINERALS) TABS tablet Take 1 tablet by mouth daily.    . predniSONE (STERAPRED UNI-PAK 21 TAB) 10 MG (21) TBPK tablet Take as directed 21 tablet 0  . sertraline (ZOLOFT) 25 MG tablet Take 1 tablet (25 mg total) by mouth daily. 30 tablet 3   No facility-administered medications prior to visit.    No Known Allergies  Review of Systems  Gastrointestinal: Negative for abdominal pain.  Genitourinary: Positive for frequency. Negative for decreased urine volume, difficulty urinating, dysuria, flank pain and urgency.       Objective:    Physical Exam  There were no vitals taken for this visit. Wt Readings from Last 3 Encounters:  09/30/20 181 lb (82.1 kg)  07/24/20 185 lb (83.9 kg)  06/02/20 186 lb 1.9 oz (84.4 kg)     Health Maintenance Due  Topic Date Due  . Hepatitis C Screening  Never done  . HIV Screening  Never done  . PAP SMEAR-Modifier  Never done  . COLONOSCOPY (Pts 45-12yrs Insurance coverage will need to be confirmed)  Never done    There are no preventive care reminders to display for this patient.   Lab Results  Component Value Date   TSH 2.280 10/13/2020   Lab Results  Component Value Date   WBC 5.5 12/03/2019   HGB 12.9 12/03/2019   HCT 37.2 12/03/2019   MCV 86.9 12/03/2019   PLT 344 12/03/2019   Lab Results  Component Value Date   NA 137 12/03/2019   K 4.0 12/03/2019   CO2 30 12/03/2019   GLUCOSE 84 12/03/2019   BUN 10 12/03/2019   CREATININE 0.77 12/03/2019   BILITOT 0.5 09/06/2016   ALKPHOS 125 (H) 09/06/2016   AST 13 09/06/2016   ALT 14 09/06/2016   PROT 7.1 09/06/2016   ALBUMIN 4.4 09/06/2016   CALCIUM 9.5 12/03/2019   ANIONGAP 10 03/25/2019   Lab Results  Component Value Date   CHOL 236 (H) 12/03/2019   Lab Results  Component Value Date   HDL 52 12/03/2019   Lab Results  Component Value Date   LDLCALC 163 (H) 12/03/2019   Lab Results  Component Value Date   TRIG 99 12/03/2019   Lab Results  Component Value Date   CHOLHDL 4.5 12/03/2019   Lab Results  Component Value Date   HGBA1C 5.4 06/02/2020   HGBA1C 5.4 06/02/2020   HGBA1C 5.4 (A) 06/02/2020   HGBA1C 5.4 06/02/2020       Assessment & Plan:   Problem List Items Addressed This Visit      Other   Urinary frequency    -started after COVID 3 weeks ago -U/A was negative -will send urine for culture -we will get routine labs next week       Relevant Orders   Urine Culture   CBC with Differential/Platelet   Basic Metabolic Panel (BMET)       No orders of the defined types were placed in this encounter.  Date:  10/30/2020   Location of Patient: Home Location of Provider: Office Consent was obtain for visit to be over via telehealth. I verified that I am speaking  with the  correct person using two identifiers.  I connected with  Kelly Buchanan on 10/30/20 via telephone and verified that I am speaking with the correct person using two identifiers.   I discussed the limitations of evaluation and management by telemedicine. The patient expressed understanding and agreed to proceed.   Time spent: 8 minutes  Noreene Larsson, NP

## 2020-10-30 NOTE — Assessment & Plan Note (Signed)
-  started after COVID 3 weeks ago -U/A was negative -will send urine for culture -we will get routine labs next week

## 2020-10-30 NOTE — Addendum Note (Signed)
Addended by: Lonn Georgia on: 10/30/2020 11:49 AM   Modules accepted: Orders

## 2020-11-01 LAB — URINE CULTURE: Organism ID, Bacteria: NO GROWTH

## 2020-11-02 NOTE — Progress Notes (Signed)
No growth on urine culture, so no need to add or change medications.

## 2020-11-03 ENCOUNTER — Other Ambulatory Visit: Payer: Self-pay

## 2020-11-03 ENCOUNTER — Encounter: Payer: Self-pay | Admitting: Nurse Practitioner

## 2020-11-03 ENCOUNTER — Ambulatory Visit (INDEPENDENT_AMBULATORY_CARE_PROVIDER_SITE_OTHER): Payer: PRIVATE HEALTH INSURANCE | Admitting: Nurse Practitioner

## 2020-11-03 DIAGNOSIS — I10 Essential (primary) hypertension: Secondary | ICD-10-CM

## 2020-11-03 DIAGNOSIS — F320 Major depressive disorder, single episode, mild: Secondary | ICD-10-CM

## 2020-11-03 DIAGNOSIS — R350 Frequency of micturition: Secondary | ICD-10-CM

## 2020-11-03 DIAGNOSIS — G4700 Insomnia, unspecified: Secondary | ICD-10-CM

## 2020-11-03 DIAGNOSIS — F419 Anxiety disorder, unspecified: Secondary | ICD-10-CM

## 2020-11-03 MED ORDER — MIRABEGRON ER 25 MG PO TB24
25.0000 mg | ORAL_TABLET | Freq: Every day | ORAL | 1 refills | Status: DC
Start: 2020-11-03 — End: 2021-03-04

## 2020-11-03 MED ORDER — SERTRALINE HCL 50 MG PO TABS: 50.0000 mg | ORAL_TABLET | Freq: Every day | ORAL | 1 refills | Status: DC

## 2020-11-03 MED ORDER — QUETIAPINE FUMARATE 50 MG PO TABS: 50.0000 mg | ORAL_TABLET | Freq: Every day | ORAL | 1 refills | Status: DC

## 2020-11-03 NOTE — Assessment & Plan Note (Addendum)
-  GAD-7 = 12 today -has been taking sertraline -INCREASE sertraline to 50 mg daily -if no improvement, will consider swapping her anxiety medication to duloxetine which has helped with stress incontinence

## 2020-11-03 NOTE — Progress Notes (Signed)
Established Patient Office Visit  Subjective:  Patient ID: Kelly Buchanan, female    DOB: [birth year]  Age: 50 y.o. MRN: [id number]  CC:  Chief Complaint  Patient presents with  . Follow-up  . Depression    HPI Kelly Buchanan presents for routine follow up and frequent urination as well as anxiety.  She states she has been taking zoloft, but she does not feel like it is doing much.  She states that she sleeps 5-6 hours per night, but her sleep is broken.  She is still having urinary frequency, but she states this is worse when she is coughing.  Past Medical History:  Diagnosis Date  . Chest heaviness 12/31/2019  . Hyperhidrosis of axilla 09/06/2016  . Hyperlipidemia   . Murmur     Past Surgical History:  Procedure Laterality Date  . APPENDECTOMY  1980's  . TONSILLECTOMY  1999  . TUBAL LIGATION  1997    Family History  Problem Relation Age of Onset  . Hypertension Mother   . Cancer Mother 52       rectal CA   . Heart disease Father 67       died at 2  . Heart attack Father   . Hypertension Father   . Alcohol abuse Father   . Cancer Sister        hodgkins  . Hypertension Sister   . Obesity Sister   . Cancer Maternal Aunt     Social History   Socioeconomic History  . Marital status: Single    Spouse name: Not on file  . Number of children: 4  . Years of education: 26  . Highest education level: Some college, no degree  Occupational History  . 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[employer]  Tobacco Use  . Smoking status: Never Smoker  . Smokeless tobacco: Never Used  Substance and Sexual Activity  . Alcohol use: No    Alcohol/week: 0.0 standard drinks  . Drug use: No  . Sexual activity: Not Currently    Birth control/protection: Surgical  Other Topics Concern  . 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 acetaminophen (TYLENOL) 500 MG tablet Take 1,000 mg by mouth every 6 (six) hours as needed.    Marland Kitchen albuterol (VENTOLIN HFA) 108 (90 Base) MCG/ACT inhaler Inhale 1-2 puffs into the lungs every 6 (six) hours as needed for wheezing or shortness of breath. 18 g 0  . atorvastatin (LIPITOR) 10 MG tablet Take 1 tablet (10 mg total)  by mouth daily. 90 tablet 1  . benzonatate (TESSALON) 100 MG capsule Take 1 capsule (100 mg total) by mouth 3 (three) times daily as needed for cough. 30 capsule 1  . famotidine (PEPCID) 20 MG tablet Take 1 tablet (20 mg total) by mouth daily. 30 mins to 1 hour prior to eating or drinking anything 30 tablet 2  . meloxicam (MOBIC) 7.5 MG tablet Take 1 tablet (7.5 mg total) by mouth daily. 30 tablet 0  . Multiple Vitamin (MULTIVITAMIN WITH MINERALS) TABS tablet Take 1 tablet by mouth daily.    . predniSONE (STERAPRED UNI-PAK 21 TAB) 10 MG (21) TBPK tablet Take as directed 21 tablet 0  . sertraline (ZOLOFT) 25 MG tablet Take 1 tablet (25 mg total) by mouth daily. 30 tablet 3   No facility-administered medications prior to visit.    No Known Allergies  ROS Review of Systems  Constitutional: Negative.   Respiratory: Negative.   Cardiovascular: Negative.   Genitourinary: Positive for frequency. Negative for decreased urine volume and dysuria.   Psychiatric/Behavioral: Negative for self-injury and suicidal ideas. The patient is nervous/anxious.       Objective:    Physical Exam Constitutional:      Appearance: Normal appearance.  Cardiovascular:     Rate and Rhythm: Normal rate and regular rhythm.     Pulses: Normal pulses.     Heart sounds: Normal heart sounds.  Pulmonary:     Effort: Pulmonary effort is normal.     Breath sounds: Normal breath sounds.  Neurological:     Mental Status: She is alert.  Psychiatric:        Mood and Affect: Mood normal.        Behavior: Behavior normal.        Thought Content: Thought content normal.        Judgment: Judgment normal.     BP (!) 142/91   Pulse 92   Temp 98.8 F (37.1 C)   Resp 20   Ht 5\' 6"  (1.676 m)   Wt 182 lb (82.6 kg)   SpO2 96%   BMI 29.38 kg/m  Wt Readings from Last 3 Encounters:  11/03/20 182 lb (82.6 kg)  09/30/20 181 lb (82.1 kg)  07/24/20 185 lb (83.9 kg)     Health Maintenance Due  Topic Date Due  . Hepatitis C Screening  Never done  . HIV Screening  Never done  . PAP SMEAR-Modifier  Never done  . COLONOSCOPY (Pts 45-20yrs Insurance coverage will need to be confirmed)  Never done    There are no preventive care reminders to display for this patient.  Lab Results  Component Value Date   TSH 2.280 10/13/2020   Lab Results  Component Value Date   WBC 5.5 12/03/2019   HGB 12.9 12/03/2019   HCT 37.2 12/03/2019   MCV 86.9 12/03/2019   PLT 344 12/03/2019   Lab Results  Component Value Date   NA 137 12/03/2019   K 4.0 12/03/2019   CO2 30 12/03/2019   GLUCOSE 84 12/03/2019   BUN 10 12/03/2019   CREATININE 0.77 12/03/2019   BILITOT 0.5 09/06/2016   ALKPHOS 125 (H) 09/06/2016   AST 13 09/06/2016   ALT 14 09/06/2016   PROT 7.1 09/06/2016   ALBUMIN 4.4 09/06/2016   CALCIUM 9.5 12/03/2019   ANIONGAP 10 03/25/2019   Lab Results  Component Value Date   CHOL 236 (H) 12/03/2019   Lab Results  Component Value Date   HDL  52  12/03/2019   Lab Results  Component Value Date   LDLCALC 163 (H) 12/03/2019   Lab Results  Component Value Date   TRIG 99 12/03/2019   Lab Results  Component Value Date   CHOLHDL 4.5 12/03/2019   Lab Results  Component Value Date   HGBA1C 5.4 06/02/2020   HGBA1C 5.4 06/02/2020   HGBA1C 5.4 (A) 06/02/2020   HGBA1C 5.4 06/02/2020      Assessment & Plan:   Problem List Items Addressed This Visit      Cardiovascular and Mediastinum   Essential hypertension    -BP slightly elevated today 142/91, but she states she is nervous -no change in meds today -will recheck in 1 month after increasing anxiety meds        Other   Urinary frequency    -urine culture was negative as well as last U/A -this is worse with coughing -unsure if stress vs urge incontinence -will try medication for overactive bladder -if no improvement, will consider swapping her anxiety medication to duloxetine which has helped with stress incontinence      Anxiety    -GAD-7 = 12 today -has been taking sertraline -INCREASE sertraline to 50 mg daily         No orders of the defined types were placed in this encounter.   Follow-up: No follow-ups on file.    Noreene Larsson, NP

## 2020-11-03 NOTE — Assessment & Plan Note (Addendum)
-  urine culture was negative as well as last U/A -this is worse with coughing -unsure if stress vs urge incontinence -will try medication for overactive bladder -if no improvement, will consider swapping her anxiety medication to duloxetine which has helped with stress incontinence -Rx. myrbetriq

## 2020-11-03 NOTE — Assessment & Plan Note (Signed)
-  BP slightly elevated today 142/91, but she states she is nervous -no change in meds today -will recheck in 1 month after increasing anxiety meds

## 2020-11-03 NOTE — Assessment & Plan Note (Signed)
-  may be related to her anxiety -Rx. seroquel

## 2020-11-03 NOTE — Patient Instructions (Signed)
We will get fasting labs today.  We increased your sertraline today, and you were prescribed myrbetiq for urinary frequency and quetiapine for insomnia.  We will met back in 1 month to see if the medications are helping.

## 2020-11-04 ENCOUNTER — Other Ambulatory Visit: Payer: Self-pay | Admitting: Nurse Practitioner

## 2020-11-04 LAB — LIPID PANEL WITH LDL/HDL RATIO
Cholesterol, Total: 253 mg/dL — ABNORMAL HIGH (ref 100–199)
HDL: 57 mg/dL (ref >39)
LDL Chol Calc (NIH): 177 mg/dL — ABNORMAL HIGH (ref 0–99)
LDL/HDL Ratio: 3.1 ratio (ref 0.0–3.2)
Triglycerides: 107 mg/dL (ref 0–149)
VLDL Cholesterol Cal: 19 mg/dL (ref 5–40)

## 2020-11-04 LAB — CBC WITH DIFFERENTIAL/PLATELET
Basophils Absolute: 0 10*3/uL (ref 0.0–0.2)
Basos: 0 %
EOS (ABSOLUTE): 0 10*3/uL (ref 0.0–0.4)
Eos: 1 %
Hematocrit: 37.1 % (ref 34.0–46.6)
Hemoglobin: 12.9 g/dL (ref 11.1–15.9)
Immature Grans (Abs): 0 10*3/uL (ref 0.0–0.1)
Immature Granulocytes: 0 %
Lymphocytes Absolute: 1.3 10*3/uL (ref 0.7–3.1)
Lymphs: 22 %
MCH: 30.1 pg (ref 26.6–33.0)
MCHC: 34.8 g/dL (ref 31.5–35.7)
MCV: 87 fL (ref 79–97)
Monocytes Absolute: 0.5 10*3/uL (ref 0.1–0.9)
Monocytes: 8 %
Neutrophils Absolute: 4 10*3/uL (ref 1.4–7.0)
Neutrophils: 69 %
Platelets: 340 10*3/uL (ref 150–450)
RBC: 4.28 x10E6/uL (ref 3.77–5.28)
RDW: 13.3 % (ref 11.7–15.4)
WBC: 5.9 10*3/uL (ref 3.4–10.8)

## 2020-11-04 LAB — CMP14+EGFR
ALT: 14 IU/L (ref 0–32)
AST: 15 IU/L (ref 0–40)
Albumin/Globulin Ratio: 1.7 (ref 1.2–2.2)
Albumin: 4.6 g/dL (ref 3.8–4.8)
Alkaline Phosphatase: 146 IU/L — ABNORMAL HIGH (ref 44–121)
BUN/Creatinine Ratio: 12 (ref 9–23)
BUN: 9 mg/dL (ref 6–24)
Bilirubin Total: 0.4 mg/dL (ref 0.0–1.2)
CO2: 26 mmol/L (ref 20–29)
Calcium: 9.2 mg/dL (ref 8.7–10.2)
Chloride: 101 mmol/L (ref 96–106)
Creatinine, Ser: 0.74 mg/dL (ref 0.57–1.00)
GFR calc Af Amer: 111 mL/min/{1.73_m2} (ref >59)
GFR calc non Af Amer: 96 mL/min/{1.73_m2} (ref >59)
Globulin, Total: 2.7 g/dL (ref 1.5–4.5)
Glucose: 86 mg/dL (ref 65–99)
Potassium: 3.8 mmol/L (ref 3.5–5.2)
Sodium: 137 mmol/L (ref 134–144)
Total Protein: 7.3 g/dL (ref 6.0–8.5)

## 2020-11-04 MED ORDER — ATORVASTATIN CALCIUM 40 MG PO TABS: 40.0000 mg | ORAL_TABLET | Freq: Every day | ORAL | 3 refills | Status: DC

## 2020-11-04 NOTE — Progress Notes (Signed)
I changed her dose from 10 mg to 40 mg of atorvastatin.

## 2020-11-04 NOTE — Progress Notes (Signed)
Her LDL or bad cholesterol is elevated at 177. The high end of normal is 100.  I will send in some cholesterol medication that should be taken in the evening.  If she agrees to take atorvastatin she will need a lab f/u in 3 months.

## 2020-11-05 ENCOUNTER — Ambulatory Visit: Payer: PRIVATE HEALTH INSURANCE | Admitting: Nurse Practitioner

## 2020-11-17 ENCOUNTER — Other Ambulatory Visit: Payer: Self-pay | Admitting: Family Medicine

## 2020-11-17 DIAGNOSIS — R059 Cough, unspecified: Secondary | ICD-10-CM

## 2020-11-17 DIAGNOSIS — R0602 Shortness of breath: Secondary | ICD-10-CM

## 2020-11-17 DIAGNOSIS — J40 Bronchitis, not specified as acute or chronic: Secondary | ICD-10-CM

## 2020-12-01 ENCOUNTER — Encounter: Payer: Self-pay | Admitting: Nurse Practitioner

## 2020-12-01 ENCOUNTER — Ambulatory Visit (INDEPENDENT_AMBULATORY_CARE_PROVIDER_SITE_OTHER): Payer: PRIVATE HEALTH INSURANCE | Admitting: Nurse Practitioner

## 2020-12-01 ENCOUNTER — Other Ambulatory Visit: Payer: Self-pay

## 2020-12-01 DIAGNOSIS — F419 Anxiety disorder, unspecified: Secondary | ICD-10-CM

## 2020-12-01 DIAGNOSIS — I10 Essential (primary) hypertension: Secondary | ICD-10-CM | POA: Diagnosis not present

## 2020-12-01 DIAGNOSIS — M5432 Sciatica, left side: Secondary | ICD-10-CM

## 2020-12-01 MED ORDER — PREDNISONE 10 MG PO TABS: ORAL_TABLET | ORAL | 0 refills | Status: AC

## 2020-12-01 MED ORDER — IBUPROFEN 600 MG PO TABS: 600.0000 mg | ORAL_TABLET | Freq: Three times a day (TID) | ORAL | 0 refills | Status: DC | PRN

## 2020-12-01 MED ORDER — TIZANIDINE HCL 4 MG PO TABS: 4.0000 mg | ORAL_TABLET | Freq: Four times a day (QID) | ORAL | 0 refills | Status: DC | PRN

## 2020-12-01 NOTE — Assessment & Plan Note (Signed)
-  BP improved after starting sertraline -no BP meds added

## 2020-12-01 NOTE — Assessment & Plan Note (Signed)
-  GAD-7 improved from 11 to 5 today -continue sertraline

## 2020-12-01 NOTE — Assessment & Plan Note (Signed)
-  Rx. Prednisone -Rx. Tizanidine -Rx. Ibuprofen -if no improvement, she will f/u with ortho

## 2020-12-01 NOTE — Patient Instructions (Signed)
Please have fasting labs drawn 2-3 days prior to your appointment so we can discuss them at your visit.

## 2020-12-01 NOTE — Progress Notes (Signed)
Acute Office Visit  Subjective:    Patient ID: Kelly Buchanan, female    DOB: [birth year], 50 y.o.   MRN: [id number]  Chief Complaint  Patient presents with  . Hypertension    HPI Patient is in today for med check. At her last visit, her BP was slightly elevated, but she was having anxiety. She was started on sertraline a month ago. Last GAD-7 = 11, rechecked today, and it is much lower.    Past Medical History:  Diagnosis Date  . Chest heaviness 12/31/2019  . Hyperhidrosis of axilla 09/06/2016  . Hyperlipidemia   . Murmur     Past Surgical History:  Procedure Laterality Date  . APPENDECTOMY  1980's  . TONSILLECTOMY  1999  . TUBAL LIGATION  1997    Family History  Problem Relation Age of Onset  . Hypertension Mother   . Cancer Mother 31       rectal CA   . Heart disease Father 33       died at 37  . Heart attack Father   . Hypertension Father   . Alcohol abuse Father   . Cancer Sister        hodgkins  . Hypertension Sister   . Obesity Sister   . Cancer Maternal Aunt     Social History   Socioeconomic History  . Marital status: Single    Spouse name: Not on file  . Number of children: 4  . Years of education: 56  . Highest education level: Some college, no degree  Occupational History  . 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[employer]  Tobacco Use  . Smoking status: Never Smoker  . Smokeless tobacco: Never Used  Substance and Sexual Activity  . Alcohol use: No    Alcohol/week: 0.0 standard drinks  . Drug use: No  . Sexual activity: Not Currently    Birth control/protection: Surgical  Other Topics Concern  . Not on file  Social History Narrative   Lives with Mother   Has four children (girls)   [place] [address] 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 acetaminophen (TYLENOL) 500 MG tablet Take 1,000 mg by mouth every 6 (six) hours as needed.    Marland Kitchen albuterol (VENTOLIN HFA) 108 (90 Base) MCG/ACT inhaler INHALE 1 TO 2 PUFFS INTO THE LUNGS EVERY 6 HOURS AS NEEDED FOR WHEEZING OR SHORTNESS OF BREATH 13.4 g 3  . atorvastatin (LIPITOR) 40 MG tablet Take 1 tablet (40 mg total) by mouth daily. 90 tablet 3  . famotidine (PEPCID) 20 MG tablet Take 1 tablet (20 mg total) by mouth daily. 30 mins to 1  hour prior to eating or drinking anything 30 tablet 2  . mirabegron ER (MYRBETRIQ) 25 MG TB24 tablet Take 1 tablet (25 mg total) by mouth daily. 30 tablet 1  . Multiple Vitamin (MULTIVITAMIN WITH MINERALS) TABS tablet Take 1 tablet by mouth daily.    . QUEtiapine (SEROQUEL) 50 MG tablet Take 1 tablet (50 mg total) by mouth at bedtime. 30 tablet 1  . sertraline (ZOLOFT) 50 MG tablet Take 1 tablet (50 mg total) by mouth daily. 30 tablet 1   No facility-administered medications prior to visit.    No Known Allergies  Review of Systems  Constitutional: Negative.   Respiratory: Negative.   Cardiovascular: Negative.   Musculoskeletal: Positive for back pain.       Radiates down left leg  Psychiatric/Behavioral: Negative.        Objective:    Physical Exam Constitutional:      Appearance: Normal appearance.  Cardiovascular:     Rate and Rhythm: Normal rate and regular rhythm.     Pulses: Normal pulses.     Heart sounds: Normal heart sounds.  Pulmonary:     Effort: Pulmonary effort is  normal.     Breath sounds: Normal breath sounds.  Musculoskeletal:     Comments: Positive left straight leg raise  Neurological:     Mental Status: She is alert.     BP 129/81   Pulse 74   Temp 98.8 F (37.1 C)   Resp 18   Ht _0  (1.702 m)   Wt 182 lb (82.6 kg)   SpO2 97%   BMI 28.51 kg/m  Wt Readings from Last 3 Encounters:  12/01/20 182 lb (82.6 kg)  11/03/20 182 lb (82.6 kg)  09/30/20 181 lb (82.1 kg)    Health Maintenance Due  Topic Date Due  . Hepatitis C Screening  Never done  . HIV Screening  Never done  . PAP SMEAR-Modifier  Never done  . COLONOSCOPY (Pts 45-37yr Insurance coverage will need to be confirmed)  Never done    There are no preventive care reminders to display for this patient.   Lab Results  Component Value Date   TSH 2.280 10/13/2020   Lab Results  Component Value Date   WBC 5.9 11/03/2020   HGB 12.9 11/03/2020   HCT 37.1 11/03/2020   MCV 87 11/03/2020   PLT 340 11/03/2020   Lab Results  Component Value Date   NA 137 11/03/2020   K 3.8 11/03/2020   CO2 26 11/03/2020   GLUCOSE 86 11/03/2020   BUN 9 11/03/2020   CREATININE 0.74 11/03/2020   BILITOT 0.4 11/03/2020   ALKPHOS 146 (H) 11/03/2020   AST 15 11/03/2020   ALT 14 11/03/2020   PROT 7.3 11/03/2020   ALBUMIN 4.6 11/03/2020   CALCIUM 9.2 11/03/2020   ANIONGAP 10 03/25/2019   Lab Results  Component Value Date   CHOL 253 (H) 11/03/2020   Lab Results  Component Value Date   HDL 57 11/03/2020   Lab Results  Component Value Date   LDLCALC 177 (H) 11/03/2020   Lab Results  Component Value Date   TRIG 107 11/03/2020   Lab Results  Component Value Date   CHOLHDL 4.5 12/03/2019   Lab Results  Component Value Date   HGBA1C 5.4 06/02/2020   HGBA1C 5.4 06/02/2020   HGBA1C 5.4 (A) 06/02/2020   HGBA1C 5.4 06/02/2020       Assessment & Plan:   Problem List Items Addressed This Visit  Cardiovascular and Mediastinum   Essential hypertension    -BP  improved after starting sertraline -no BP meds added      Relevant Orders   CBC with Differential/Platelet   CMP14+EGFR   Lipid Panel With LDL/HDL Ratio     Nervous and Auditory   Left sided sciatica    -Rx. Prednisone -Rx. Tizanidine -Rx. Ibuprofen -if no improvement, she will f/u with ortho      Relevant Medications   predniSONE (DELTASONE) 10 MG tablet   tiZANidine (ZANAFLEX) 4 MG tablet   ibuprofen (ADVIL) 600 MG tablet     Other   Anxiety    -GAD-7 improved from 11 to 5 today -continue sertraline          Meds ordered this encounter  Medications  . predniSONE (DELTASONE) 10 MG tablet    Sig: Take 6 tablets (60 mg total) by mouth daily with breakfast for 1 day, THEN 5 tablets (50 mg total) daily with breakfast for 1 day, THEN 4 tablets (40 mg total) daily with breakfast for 1 day, THEN 3 tablets (30 mg total) daily with breakfast for 1 day, THEN 2 tablets (20 mg total) daily with breakfast for 1 day, THEN 1 tablet (10 mg total) daily with breakfast for 1 day.    Dispense:  21 tablet    Refill:  0  . tiZANidine (ZANAFLEX) 4 MG tablet    Sig: Take 1 tablet (4 mg total) by mouth every 6 (six) hours as needed for muscle spasms.    Dispense:  30 tablet    Refill:  0  . ibuprofen (ADVIL) 600 MG tablet    Sig: Take 1 tablet (600 mg total) by mouth every 8 (eight) hours as needed for headache, mild pain or moderate pain.    Dispense:  30 tablet    Refill:  0     Noreene Larsson, NP

## 2021-02-02 ENCOUNTER — Ambulatory Visit: Payer: PRIVATE HEALTH INSURANCE | Admitting: Nurse Practitioner

## 2021-02-05 ENCOUNTER — Other Ambulatory Visit: Payer: Self-pay | Admitting: Nurse Practitioner

## 2021-02-05 DIAGNOSIS — F320 Major depressive disorder, single episode, mild: Secondary | ICD-10-CM

## 2021-02-25 ENCOUNTER — Ambulatory Visit: Payer: PRIVATE HEALTH INSURANCE | Admitting: Nurse Practitioner

## 2021-03-04 ENCOUNTER — Encounter (INDEPENDENT_AMBULATORY_CARE_PROVIDER_SITE_OTHER): Payer: Self-pay | Admitting: *Deleted

## 2021-03-04 ENCOUNTER — Ambulatory Visit (INDEPENDENT_AMBULATORY_CARE_PROVIDER_SITE_OTHER): Payer: PRIVATE HEALTH INSURANCE | Admitting: Nurse Practitioner

## 2021-03-04 ENCOUNTER — Other Ambulatory Visit: Payer: Self-pay

## 2021-03-04 ENCOUNTER — Encounter: Payer: Self-pay | Admitting: Nurse Practitioner

## 2021-03-04 VITALS — BP 129/76 | HR 72 | Temp 97.7°F | Ht 67.0 in | Wt 183.0 lb

## 2021-03-04 DIAGNOSIS — F419 Anxiety disorder, unspecified: Secondary | ICD-10-CM | POA: Diagnosis not present

## 2021-03-04 DIAGNOSIS — E785 Hyperlipidemia, unspecified: Secondary | ICD-10-CM

## 2021-03-04 DIAGNOSIS — R350 Frequency of micturition: Secondary | ICD-10-CM | POA: Diagnosis not present

## 2021-03-04 DIAGNOSIS — Z139 Encounter for screening, unspecified: Secondary | ICD-10-CM | POA: Diagnosis not present

## 2021-03-04 DIAGNOSIS — I10 Essential (primary) hypertension: Secondary | ICD-10-CM | POA: Diagnosis not present

## 2021-03-04 DIAGNOSIS — Z1211 Encounter for screening for malignant neoplasm of colon: Secondary | ICD-10-CM

## 2021-03-04 LAB — POCT URINALYSIS DIPSTICK
Bilirubin, UA: NEGATIVE
Blood, UA: NEGATIVE
Glucose, UA: NEGATIVE
Ketones, UA: NEGATIVE
Leukocytes, UA: NEGATIVE
Nitrite, UA: NEGATIVE
Protein, UA: NEGATIVE
Spec Grav, UA: 1.01 (ref 1.010–1.025)
Urobilinogen, UA: 0.2 E.U./dL
pH, UA: 6 (ref 5.0–8.0)

## 2021-03-04 MED ORDER — OXYBUTYNIN CHLORIDE ER 10 MG PO TB24: 10.0000 mg | ORAL_TABLET | Freq: Every day | ORAL | 1 refills | Status: DC

## 2021-03-04 NOTE — Assessment & Plan Note (Addendum)
-  started her on myrbetriq at last OV, but today she states she couldn't afford it -Rx. Oxybutynin -she would like urology referral; will honor that request -U/A today was negative

## 2021-03-04 NOTE — Assessment & Plan Note (Signed)
-  recheck lipid panel today -atorvastatin was increased in February

## 2021-03-04 NOTE — Addendum Note (Signed)
Addended by: Laretta Bolster on: 03/04/2021 12:05 PM   Modules accepted: Orders

## 2021-03-04 NOTE — Assessment & Plan Note (Signed)
-  she would like PAP with next physical; will set up with Jarrett Soho

## 2021-03-04 NOTE — Assessment & Plan Note (Signed)
BP well controlled.

## 2021-03-04 NOTE — Progress Notes (Addendum)
Established Patient Office Visit  Subjective:  Patient ID: Kelly Buchanan, female    DOB: July 18, 1971  Age: 50 y.o. MRN: [id number]  CC:  Chief Complaint  Patient presents with  . Hypertension    Follow up    HPI [name] presents for lab follow-up. Her LDL on 11/03/20 was 177. At that time, we increased her atorvastatin to 40 mg daily.  She has not had fasting labs drawn prior to this visit.  She is concerned with diarrhea that started over the weekend that resolved on Wednesday. She states that her stomach is doing some rumbling afterwards. She states she hasn't changed her diet.  Past Medical History:  Diagnosis Date  . Chest heaviness 12/31/2019  . Hyperhidrosis of axilla 09/06/2016  . Hyperlipidemia   . Murmur     Past Surgical History:  Procedure Laterality Date  . APPENDECTOMY  1980's  . TONSILLECTOMY  1999  . TUBAL LIGATION  1997    Family History  Problem Relation Age of Onset  . Hypertension Mother   . Cancer Mother 32       rectal CA   . Heart disease Father 45       died at 28  . Heart attack Father   . Hypertension Father   . Alcohol abuse Father   . Cancer Sister        hodgkins  . Hypertension Sister   . Obesity Sister   . Cancer Maternal Aunt     Social History   Socioeconomic History  . Marital status: Single    Spouse name: Not on file  . Number of children: 4  . Years of education: 38  . Highest education level: Some college, no degree  Occupational History  . 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[employer]  Tobacco Use  . Smoking status: Never Smoker  . Smokeless tobacco: Never Used  Substance and Sexual Activity  . Alcohol use: No    Alcohol/week: 0.0 standard drinks  . Drug use: No  . Sexual activity: Not Currently    Birth control/protection: Surgical  Other Topics Concern  . Not on file  Social History Narrative   Lives with Mother   Has four children (girls)   [place] [address] 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 acetaminophen (TYLENOL) 500 MG tablet Take 1,000 mg by mouth every 6 (six) hours as needed.    Marland Kitchen atorvastatin (LIPITOR) 40 MG tablet Take 1 tablet (40 mg total) by mouth daily. 90 tablet 3  . ibuprofen (ADVIL) 600 MG tablet Take 1 tablet (600 mg total) by mouth every 8 (eight) hours as  needed for headache, mild pain or moderate pain. 30 tablet 0  . Multiple Vitamin (MULTIVITAMIN WITH MINERALS) TABS tablet Take 1 tablet by mouth daily.    . sertraline (ZOLOFT) 50 MG tablet TAKE 1 TABLET(50 MG) BY MOUTH DAILY 30 tablet 1  . albuterol (VENTOLIN HFA) 108 (90 Base) MCG/ACT inhaler INHALE 1 TO 2 PUFFS INTO THE LUNGS EVERY 6 HOURS AS NEEDED FOR WHEEZING OR SHORTNESS OF BREATH (Patient not taking: Reported on 03/04/2021) 13.4 g 3  . famotidine (PEPCID) 20 MG tablet Take 1 tablet (20 mg total) by mouth daily. 30 mins to 1 hour prior to eating or drinking anything (Patient not taking: Reported on 03/04/2021) 30 tablet 2  . mirabegron ER (MYRBETRIQ) 25 MG TB24 tablet Take 1 tablet (25 mg total) by mouth daily. (Patient not taking: Reported on 03/04/2021) 30 tablet 1  . QUEtiapine (SEROQUEL) 50 MG tablet Take 1 tablet (50 mg total) by mouth at bedtime. (Patient not taking: Reported on 03/04/2021) 30 tablet 1  . tiZANidine (ZANAFLEX) 4 MG tablet Take 1 tablet  (4 mg total) by mouth every 6 (six) hours as needed for muscle spasms. (Patient not taking: No sig reported) 30 tablet 0   No facility-administered medications prior to visit.    No Known Allergies  ROS Review of Systems  Constitutional: Negative.   Respiratory: Negative.   Cardiovascular: Negative.   Gastrointestinal:       Had diarrhea this weekend that resolved yesterday  Genitourinary: Positive for frequency.      Objective:    Physical Exam Constitutional:      Appearance: Normal appearance.  Cardiovascular:     Rate and Rhythm: Normal rate and regular rhythm.     Pulses: Normal pulses.     Heart sounds: Normal heart sounds.  Pulmonary:     Effort: Pulmonary effort is normal.     Breath sounds: Normal breath sounds.  Abdominal:     General: Abdomen is flat. Bowel sounds are normal.     Palpations: Abdomen is soft.  Neurological:     Mental Status: She is alert.  Psychiatric:        Mood and Affect: Mood normal.        Behavior: Behavior normal.        Thought Content: Thought content normal.        Judgment: Judgment normal.     BP 129/76 (BP Location: Right Arm, Patient Position: Sitting, Cuff Size: Large)   Pulse 72   Temp 97.7 F (36.5 C) (Temporal)   Ht $R'5\' 7"'fP$  (1.702 m)   Wt 183 lb (83 kg)   LMP 02/27/2021   SpO2 96%   BMI 28.66 kg/m  Wt Readings from Last 3 Encounters:  03/04/21 183 lb (83 kg)  12/01/20 182 lb (82.6 kg)  11/03/20 182 lb (82.6 kg)     Health Maintenance Due  Topic Date Due  . HIV Screening  Never done  . Hepatitis C Screening  Never done  . PAP SMEAR-Modifier  Never done  . COLONOSCOPY (Pts 45-83yrs Insurance coverage will need to be confirmed)  Never done  . COVID-19 Vaccine (3 - Booster for Moderna series) 11/11/2020    There are no preventive care reminders to display for this patient.  Lab Results  Component Value Date   TSH 2.280 10/13/2020   Lab Results  Component Value Date   WBC 5.9 11/03/2020   HGB 12.9  11/03/2020   HCT 37.1 11/03/2020   MCV 87 11/03/2020  PLT 340 11/03/2020   Lab Results  Component Value Date   NA 137 11/03/2020   K 3.8 11/03/2020   CO2 26 11/03/2020   GLUCOSE 86 11/03/2020   BUN 9 11/03/2020   CREATININE 0.74 11/03/2020   BILITOT 0.4 11/03/2020   ALKPHOS 146 (H) 11/03/2020   AST 15 11/03/2020   ALT 14 11/03/2020   PROT 7.3 11/03/2020   ALBUMIN 4.6 11/03/2020   CALCIUM 9.2 11/03/2020   ANIONGAP 10 03/25/2019   Lab Results  Component Value Date   CHOL 253 (H) 11/03/2020   Lab Results  Component Value Date   HDL 57 11/03/2020   Lab Results  Component Value Date   LDLCALC 177 (H) 11/03/2020   Lab Results  Component Value Date   TRIG 107 11/03/2020   Lab Results  Component Value Date   CHOLHDL 4.5 12/03/2019   Lab Results  Component Value Date   HGBA1C 5.4 06/02/2020   HGBA1C 5.4 06/02/2020   HGBA1C 5.4 (A) 06/02/2020   HGBA1C 5.4 06/02/2020      Assessment & Plan:   Problem List Items Addressed This Visit      Cardiovascular and Mediastinum   Essential hypertension    -BP well controlled      Relevant Orders   CBC with Differential/Platelet   CMP14+EGFR     Other   Hyperlipidemia    -recheck lipid panel today -atorvastatin was increased in February      Relevant Orders   Lipid Panel With LDL/HDL Ratio   Urinary frequency    -started her on myrbetriq at last OV, but today she states she couldn't afford it -Rx. Oxybutynin -she would like urology referral; will honor that request -U/A today was negative      Relevant Medications   oxybutynin (DITROPAN XL) 10 MG 24 hr tablet   Other Relevant Orders   POCT Urinalysis Dipstick   Anxiety    -doing well with sertraline; continue this      Screening due    -she would like PAP with next physical; will set up with Jarrett Soho      Relevant Orders   HIV Antibody (routine testing w rflx)   Hepatitis C antibody    Other Visit Diagnoses    Screening for malignant neoplasm of  colon    -  Primary   Relevant Orders   Ambulatory referral to Gastroenterology      Meds ordered this encounter  Medications  . oxybutynin (DITROPAN XL) 10 MG 24 hr tablet    Sig: Take 1 tablet (10 mg total) by mouth at bedtime.    Dispense:  30 tablet    Refill:  1    Follow-up: Return in about 3 months (around 06/04/2021) for Physical Exam with PAP.    Noreene Larsson, NP

## 2021-03-04 NOTE — Patient Instructions (Signed)
Please have fasting labs drawn today. 

## 2021-03-04 NOTE — Assessment & Plan Note (Signed)
-  doing well with sertraline; continue this

## 2021-03-05 ENCOUNTER — Other Ambulatory Visit: Payer: Self-pay | Admitting: Nurse Practitioner

## 2021-03-05 ENCOUNTER — Encounter: Payer: Self-pay | Admitting: Nurse Practitioner

## 2021-03-05 DIAGNOSIS — R197 Diarrhea, unspecified: Secondary | ICD-10-CM

## 2021-03-05 LAB — CMP14+EGFR
ALT: 50 IU/L — ABNORMAL HIGH (ref 0–32)
AST: 23 IU/L (ref 0–40)
Albumin/Globulin Ratio: 1.9 (ref 1.2–2.2)
Albumin: 4.9 g/dL — ABNORMAL HIGH (ref 3.8–4.8)
Alkaline Phosphatase: 197 IU/L — ABNORMAL HIGH (ref 44–121)
BUN/Creatinine Ratio: 11 (ref 9–23)
BUN: 8 mg/dL (ref 6–24)
Bilirubin Total: 0.5 mg/dL (ref 0.0–1.2)
CO2: 27 mmol/L (ref 20–29)
Calcium: 9.5 mg/dL (ref 8.7–10.2)
Chloride: 99 mmol/L (ref 96–106)
Creatinine, Ser: 0.75 mg/dL (ref 0.57–1.00)
Globulin, Total: 2.6 g/dL (ref 1.5–4.5)
Glucose: 80 mg/dL (ref 65–99)
Potassium: 4.2 mmol/L (ref 3.5–5.2)
Sodium: 138 mmol/L (ref 134–144)
Total Protein: 7.5 g/dL (ref 6.0–8.5)
eGFR: 98 mL/min/{1.73_m2} (ref >59)

## 2021-03-05 LAB — CBC WITH DIFFERENTIAL/PLATELET
Basophils Absolute: 0 10*3/uL (ref 0.0–0.2)
Basos: 0 %
EOS (ABSOLUTE): 0 10*3/uL (ref 0.0–0.4)
Eos: 1 %
Hematocrit: 40 % (ref 34.0–46.6)
Hemoglobin: 13.3 g/dL (ref 11.1–15.9)
Immature Grans (Abs): 0 10*3/uL (ref 0.0–0.1)
Immature Granulocytes: 0 %
Lymphocytes Absolute: 1.5 10*3/uL (ref 0.7–3.1)
Lymphs: 25 %
MCH: 29 pg (ref 26.6–33.0)
MCHC: 33.3 g/dL (ref 31.5–35.7)
MCV: 87 fL (ref 79–97)
Monocytes Absolute: 0.5 10*3/uL (ref 0.1–0.9)
Monocytes: 8 %
Neutrophils Absolute: 4.1 10*3/uL (ref 1.4–7.0)
Neutrophils: 66 %
Platelets: 336 10*3/uL (ref 150–450)
RBC: 4.58 x10E6/uL (ref 3.77–5.28)
RDW: 13 % (ref 11.7–15.4)
WBC: 6.2 10*3/uL (ref 3.4–10.8)

## 2021-03-05 LAB — LIPID PANEL WITH LDL/HDL RATIO
Cholesterol, Total: 178 mg/dL (ref 100–199)
HDL: 58 mg/dL (ref >39)
LDL Chol Calc (NIH): 104 mg/dL — ABNORMAL HIGH (ref 0–99)
LDL/HDL Ratio: 1.8 ratio (ref 0.0–3.2)
Triglycerides: 90 mg/dL (ref 0–149)
VLDL Cholesterol Cal: 16 mg/dL (ref 5–40)

## 2021-03-05 LAB — HEPATITIS C ANTIBODY: Hep C Virus Ab: 0.1 s/co ratio (ref 0.0–0.9)

## 2021-03-05 LAB — HIV ANTIBODY (ROUTINE TESTING W REFLEX): HIV Screen 4th Generation wRfx: NONREACTIVE

## 2021-03-05 NOTE — Progress Notes (Signed)
Your alkaline phosphatase was elevated. This is a non-specific enzyme that can indicate issues with your gallbladder, skeleton, or other issues. It is most likely elevated due to the diarrheal illness that you recently experienced, but lets recheck your labs in 7-10 days to make sure this has resolved. If you have any more GI issues let us know.

## 2021-03-08 ENCOUNTER — Telehealth: Payer: Self-pay

## 2021-03-08 NOTE — Telephone Encounter (Signed)
Returning call.

## 2021-03-10 NOTE — Telephone Encounter (Signed)
Pt made aware of lab results.

## 2021-03-13 LAB — CMP14+EGFR
ALT: 34 IU/L — ABNORMAL HIGH (ref 0–32)
AST: 17 IU/L (ref 0–40)
Albumin/Globulin Ratio: 1.6 (ref 1.2–2.2)
Albumin: 4.3 g/dL (ref 3.8–4.8)
Alkaline Phosphatase: 175 IU/L — ABNORMAL HIGH (ref 44–121)
BUN/Creatinine Ratio: 14 (ref 9–23)
BUN: 10 mg/dL (ref 6–24)
Bilirubin Total: 0.3 mg/dL (ref 0.0–1.2)
CO2: 23 mmol/L (ref 20–29)
Calcium: 9.3 mg/dL (ref 8.7–10.2)
Chloride: 101 mmol/L (ref 96–106)
Creatinine, Ser: 0.74 mg/dL (ref 0.57–1.00)
Globulin, Total: 2.7 g/dL (ref 1.5–4.5)
Glucose: 103 mg/dL — ABNORMAL HIGH (ref 65–99)
Potassium: 3.8 mmol/L (ref 3.5–5.2)
Sodium: 137 mmol/L (ref 134–144)
Total Protein: 7 g/dL (ref 6.0–8.5)
eGFR: 100 mL/min/{1.73_m2} (ref >59)

## 2021-03-13 LAB — CBC WITH DIFFERENTIAL/PLATELET
Basophils Absolute: 0 10*3/uL (ref 0.0–0.2)
Basos: 0 %
EOS (ABSOLUTE): 0 10*3/uL (ref 0.0–0.4)
Eos: 0 %
Hematocrit: 37.3 % (ref 34.0–46.6)
Hemoglobin: 12.9 g/dL (ref 11.1–15.9)
Immature Grans (Abs): 0 10*3/uL (ref 0.0–0.1)
Immature Granulocytes: 0 %
Lymphocytes Absolute: 1.5 10*3/uL (ref 0.7–3.1)
Lymphs: 25 %
MCH: 29.7 pg (ref 26.6–33.0)
MCHC: 34.6 g/dL (ref 31.5–35.7)
MCV: 86 fL (ref 79–97)
Monocytes Absolute: 0.5 10*3/uL (ref 0.1–0.9)
Monocytes: 9 %
Neutrophils Absolute: 3.8 10*3/uL (ref 1.4–7.0)
Neutrophils: 66 %
Platelets: 305 10*3/uL (ref 150–450)
RBC: 4.35 x10E6/uL (ref 3.77–5.28)
RDW: 12.8 % (ref 11.7–15.4)
WBC: 5.8 10*3/uL (ref 3.4–10.8)

## 2021-03-13 LAB — LIPASE: Lipase: 45 U/L (ref 14–72)

## 2021-03-15 NOTE — Progress Notes (Signed)
Liver function tests are a little better. Alt is down to 34 from 50, normal is 0-32, so that is going the right direction. AP is 175, and was at 197.The high end of normal for AP is 121. Are you having any abdominal pain now?

## 2021-04-30 ENCOUNTER — Other Ambulatory Visit: Payer: Self-pay

## 2021-04-30 ENCOUNTER — Telehealth: Payer: Self-pay

## 2021-04-30 DIAGNOSIS — F320 Major depressive disorder, single episode, mild: Secondary | ICD-10-CM

## 2021-04-30 MED ORDER — SERTRALINE HCL 50 MG PO TABS: ORAL_TABLET | ORAL | 1 refills | Status: DC

## 2021-04-30 NOTE — Telephone Encounter (Signed)
Patient called need med refill sertraline (ZOLOFT) 50 MG tablet   [place]

## 2021-04-30 NOTE — Telephone Encounter (Signed)
Rx sent 

## 2021-05-10 ENCOUNTER — Encounter: Payer: Self-pay | Admitting: Nurse Practitioner

## 2021-05-11 ENCOUNTER — Encounter: Payer: Self-pay | Admitting: Internal Medicine

## 2021-05-11 ENCOUNTER — Other Ambulatory Visit: Payer: Self-pay

## 2021-05-11 ENCOUNTER — Telehealth (INDEPENDENT_AMBULATORY_CARE_PROVIDER_SITE_OTHER): Payer: No Typology Code available for payment source | Admitting: Internal Medicine

## 2021-05-11 DIAGNOSIS — Z20822 Contact with and (suspected) exposure to covid-19: Secondary | ICD-10-CM

## 2021-05-11 NOTE — Progress Notes (Signed)
Virtual Visit via Telephone Note   This visit type was conducted due to national recommendations for restrictions regarding the COVID-19 Pandemic (e.g. social distancing) in an effort to limit this patient's exposure and mitigate transmission in our community.  Due to her co-morbid illnesses, this patient is at least at moderate risk for complications without adequate follow up.  This format is felt to be most appropriate for this patient at this time.  The patient did not have access to video technology/had technical difficulties with video requiring transitioning to audio format only (telephone).  All issues noted in this document were discussed and addressed.  No physical exam could be performed with this format.  Evaluation Performed:  Follow-up visit  Date:  05/11/2021   ID:  Kelly Buchanan, DOB 1971-02-16, MRN [id number]  Patient Location: Home Provider Location: Office/Clinic  Participants: Patient Location of Patient: Home Location of Provider: Telehealth Consent was obtain for visit to be over via telehealth. I verified that I am speaking with the correct person using two identifiers.  PCP:  Kelly Larsson, NP   Chief Complaint:    History of Present Illness:    Kelly Buchanan is a 50 y.o. female who has a televisit for c/o nasal congestion, fever and myalgias for last 2 days. She does not have fever today. She denies any dyspnea, wheezing or palpitations. She has been taking Mucinex and Tylenol as needed. She had a COVID test at her local pharmacy today.  The patient does have symptoms concerning for COVID-19 infection (fever, chills, cough, or new shortness of breath).   Past Medical, Surgical, Social History, Allergies, and Medications have been Reviewed.  Past Medical History:  Diagnosis Date   Chest heaviness 12/31/2019   Hyperhidrosis of axilla 09/06/2016   Hyperlipidemia    Murmur    Past Surgical History:  Procedure Laterality Date   APPENDECTOMY  1980's    TONSILLECTOMY  1999   TUBAL LIGATION  1997     Current Meds  Medication Sig   acetaminophen (TYLENOL) 500 MG tablet Take 1,000 mg by mouth every 6 (six) hours as needed.   atorvastatin (LIPITOR) 40 MG tablet Take 1 tablet (40 mg total) by mouth daily.   ibuprofen (ADVIL) 600 MG tablet Take 1 tablet (600 mg total) by mouth every 8 (eight) hours as needed for headache, mild pain or moderate pain.   Multiple Vitamin (MULTIVITAMIN WITH MINERALS) TABS tablet Take 1 tablet by mouth daily.   oxybutynin (DITROPAN XL) 10 MG 24 hr tablet Take 1 tablet (10 mg total) by mouth at bedtime.   sertraline (ZOLOFT) 50 MG tablet TAKE 1 TABLET(50 MG) BY MOUTH DAILY     Allergies:   Patient has no known allergies.   ROS:   Please see the history of present illness.     All other systems reviewed and are negative.   Labs/Other Tests and Data Reviewed:    Recent Labs: 10/13/2020: TSH 2.280 03/12/2021: ALT 34; BUN 10; Creatinine, Ser 0.74; Hemoglobin 12.9; Platelets 305; Potassium 3.8; Sodium 137   Recent Lipid Panel Lab Results  Component Value Date/Time   CHOL 178 03/04/2021 11:58 AM   TRIG 90 03/04/2021 11:58 AM   HDL 58 03/04/2021 11:58 AM   CHOLHDL 4.5 12/03/2019 11:45 AM   LDLCALC 104 (H) 03/04/2021 11:58 AM   LDLCALC 163 (H) 12/03/2019 11:45 AM    Wt Readings from Last 3 Encounters:  03/04/21 183 lb (83 kg)  12/01/20 182 lb (  82.6 kg)  11/03/20 182 lb (82.6 kg)     ASSESSMENT & PLAN:    Suspected COVID-19 infection Check COVID RT-PCR Mucinex or Robitussin PRN for cough Tylenol PRN for fever/myalgias Self-quarantine for now  Time:   Today, I have spent 7 minutes reviewing the chart, including problem list, medications, and with the patient with telehealth technology discussing the above problems.   Medication Adjustments/Labs and Tests Ordered: Current medicines are reviewed at length with the patient today.  Concerns regarding medicines are outlined above.   Tests Ordered: No  orders of the defined types were placed in this encounter.   Medication Changes: No orders of the defined types were placed in this encounter.    Note: This dictation was prepared with Dragon dictation along with smaller phrase technology. Similar sounding words can be transcribed inadequately or may not be corrected upon review. Any transcriptional errors that result from this process are unintentional.      Disposition:  Follow up  Signed, Lindell Spar, MD  05/11/2021 9:34 AM     [name]

## 2021-05-11 NOTE — Telephone Encounter (Signed)
Work in added to [employer] schedule 05-11-21

## 2021-05-12 ENCOUNTER — Encounter: Payer: Self-pay | Admitting: Internal Medicine

## 2021-05-12 ENCOUNTER — Other Ambulatory Visit: Payer: Self-pay | Admitting: Internal Medicine

## 2021-05-12 ENCOUNTER — Ambulatory Visit (INDEPENDENT_AMBULATORY_CARE_PROVIDER_SITE_OTHER): Payer: No Typology Code available for payment source | Admitting: *Deleted

## 2021-05-12 ENCOUNTER — Telehealth: Payer: Self-pay | Admitting: *Deleted

## 2021-05-12 ENCOUNTER — Other Ambulatory Visit: Payer: Self-pay

## 2021-05-12 DIAGNOSIS — J069 Acute upper respiratory infection, unspecified: Secondary | ICD-10-CM

## 2021-05-12 DIAGNOSIS — R059 Cough, unspecified: Secondary | ICD-10-CM | POA: Diagnosis not present

## 2021-05-12 LAB — POCT INFLUENZA A/B
Influenza A, POC: NEGATIVE
Influenza B, POC: NEGATIVE

## 2021-05-12 LAB — POCT RAPID STREP A (OFFICE): Rapid Strep A Screen: NEGATIVE

## 2021-05-12 MED ORDER — AZITHROMYCIN 250 MG PO TABS: ORAL_TABLET | ORAL | 0 refills | Status: AC

## 2021-05-12 NOTE — Telephone Encounter (Signed)
Pt advised with verbal understanding pt is requesting work note 8-9 to 8-11 ok to provide?

## 2021-05-12 NOTE — Telephone Encounter (Signed)
Pt advised of recommendation per patel

## 2021-05-12 NOTE — Telephone Encounter (Signed)
Strep and flu test both negative what are next recommendations for pt?

## 2021-05-13 ENCOUNTER — Encounter: Payer: Self-pay | Admitting: *Deleted

## 2021-05-13 NOTE — Telephone Encounter (Signed)
Letter provided for pt

## 2021-05-19 ENCOUNTER — Encounter: Payer: Self-pay | Admitting: Internal Medicine

## 2021-05-19 ENCOUNTER — Other Ambulatory Visit: Payer: Self-pay

## 2021-05-19 ENCOUNTER — Ambulatory Visit (INDEPENDENT_AMBULATORY_CARE_PROVIDER_SITE_OTHER): Payer: No Typology Code available for payment source | Admitting: Internal Medicine

## 2021-05-19 VITALS — BP 150/95 | HR 73 | Temp 98.5°F | Resp 18 | Ht 67.0 in | Wt 189.0 lb

## 2021-05-19 DIAGNOSIS — J209 Acute bronchitis, unspecified: Secondary | ICD-10-CM

## 2021-05-19 DIAGNOSIS — Z20822 Contact with and (suspected) exposure to covid-19: Secondary | ICD-10-CM

## 2021-05-19 MED ORDER — PREDNISONE 10 MG (21) PO TBPK: ORAL_TABLET | ORAL | 0 refills | Status: DC

## 2021-05-19 MED ORDER — PROMETHAZINE-DM 6.25-15 MG/5ML PO SYRP
5.0000 mL | ORAL_SOLUTION | Freq: Four times a day (QID) | ORAL | 0 refills | Status: DC | PRN
Start: 2021-05-19 — End: 2021-09-29

## 2021-05-19 NOTE — Progress Notes (Signed)
Acute Office Visit  Subjective:    Patient ID: Kelly Buchanan, female    DOB: 1971-01-15, 50 y.o.   MRN: [id number]  Chief Complaint  Patient presents with   Cough    Pt has been sick since 05-10-21 she has cough headache and thick green mucous she did come here for flu and strep test however she did covid rapid test at [facility] that was negative right after symptoms started     HPI Patient is in today for evaluation of persistent cough, fatigue and headache despite completing Azithromycin. Of note, she had negative Rapid COVID, flu and Strep test in the last week. She mentions that her fever and fatigue have resolved, but continues to have cough with thick sputum and chest tightness at times. Denies any wheezing currently. Denies any chest pain or palpitations.  Past Medical History:  Diagnosis Date   Chest heaviness 12/31/2019   Hyperhidrosis of axilla 09/06/2016   Hyperlipidemia    Murmur     Past Surgical History:  Procedure Laterality Date   APPENDECTOMY  46's   TONSILLECTOMY  1999   TUBAL LIGATION  1997    Family History  Problem Relation Age of Onset   Hypertension Mother    Cancer Mother 76       rectal CA    Heart disease Father 54       died at 52   Heart attack Father    Hypertension Father    Alcohol abuse Father    Cancer Sister        hodgkins   Hypertension Sister    Obesity Sister    Cancer Maternal Aunt     Social History   Socioeconomic History   Marital status: Single    Spouse name: Not on file   Number of children: 4   Years of education: 14   Highest education level: Some college, no degree  Occupational History   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[employer]  Tobacco Use   Smoking status: Never   Smokeless tobacco: Never  Substance and Sexual Activity   Alcohol use: No    Alcohol/week: 0.0 standard drinks   Drug use: No   Sexual activity: Not Currently    Birth control/protection: Surgical  Other Topics Concern    Not on file  Social History Narrative   Lives with Mother   Has four children (girls)   [place] 25      Engineer, 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   Caffeine: 2 cups coffee mountain dew 20 oz every other day   Water: 1/2 gallon but rare      Wears seat belt   Does not wear sunscreens   Smoke detectors at home   Does not text and drive   Social Determinants of Radio broadcast assistant Strain: Not on file  Food Insecurity: Not on file  Transportation Needs: Not on file  Physical Activity: Not on file  Stress: Not on file  Social Connections: Not on file  Intimate Partner Violence: Not on file    Outpatient Medications Prior to Visit  Medication Sig Dispense Refill   acetaminophen (TYLENOL) 500 MG tablet Take 1,000 mg by mouth every 6 (six) hours as needed.     atorvastatin (LIPITOR) 40 MG tablet Take 1 tablet (40  mg total) by mouth daily. 90 tablet 3   ibuprofen (ADVIL) 600 MG tablet Take 1 tablet (600 mg total) by mouth every 8 (eight) hours as needed for headache, mild pain or moderate pain. 30 tablet 0   Multiple Vitamin (MULTIVITAMIN WITH MINERALS) TABS tablet Take 1 tablet by mouth daily.     oxybutynin (DITROPAN XL) 10 MG 24 hr tablet Take 1 tablet (10 mg total) by mouth at bedtime. 30 tablet 1   sertraline (ZOLOFT) 50 MG tablet TAKE 1 TABLET(50 MG) BY MOUTH DAILY 30 tablet 1   No facility-administered medications prior to visit.    No Known Allergies  Review of Systems  Constitutional:  Negative for chills and fever.  HENT:  Negative for congestion, sinus pressure, sinus pain and sore throat.   Eyes:  Negative for pain and discharge.  Respiratory:  Positive for cough and chest tightness. Negative for shortness of breath.   Cardiovascular:  Negative for chest pain and palpitations.  Gastrointestinal:  Negative for abdominal pain, constipation, diarrhea,  nausea and vomiting.  Endocrine: Negative for polydipsia and polyuria.  Genitourinary:  Negative for dysuria and hematuria.  Musculoskeletal:  Negative for neck pain and neck stiffness.  Skin:  Negative for rash.  Neurological:  Positive for headaches. Negative for dizziness and weakness.  Psychiatric/Behavioral:  Negative for agitation and behavioral problems.       Objective:    Physical Exam Vitals reviewed.  Constitutional:      General: She is not in acute distress.    Appearance: She is not diaphoretic.  HENT:     Head: Normocephalic and atraumatic.     Nose: Nose normal.     Mouth/Throat:     Mouth: Mucous membranes are moist.  Eyes:     General: No scleral icterus.    Extraocular Movements: Extraocular movements intact.  Cardiovascular:     Rate and Rhythm: Normal rate and regular rhythm.     Pulses: Normal pulses.     Heart sounds: Normal heart sounds. No murmur heard. Pulmonary:     Breath sounds: Normal breath sounds. No wheezing or rales.  Musculoskeletal:     Cervical back: Neck supple. No tenderness.     Right lower leg: No edema.     Left lower leg: No edema.  Skin:    General: Skin is warm.     Findings: No rash.  Neurological:     General: No focal deficit present.     Mental Status: She is alert and oriented to person, place, and time.  Psychiatric:        Mood and Affect: Mood normal.        Behavior: Behavior normal.    BP (!) 150/95 (BP Location: Left Arm, Patient Position: Sitting, Cuff Size: Normal)   Pulse 73   Temp 98.5 F (36.9 C) (Oral)   Resp 18   Ht $R'5\' 7"'qF$  (1.702 m)   Wt 189 lb 0.6 oz (85.7 kg)   SpO2 97%   BMI 29.61 kg/m  Wt Readings from Last 3 Encounters:  05/19/21 189 lb 0.6 oz (85.7 kg)  03/04/21 183 lb (83 kg)  12/01/20 182 lb (82.6 kg)    Health Maintenance Due  Topic Date Due   PAP SMEAR-Modifier  Never done   COLONOSCOPY (Pts 45-110yrs Insurance coverage will need to be confirmed)  Never done   COVID-19 Vaccine (3 -  Booster for Moderna series) 11/11/2020   INFLUENZA VACCINE  05/03/2021    There are  no preventive care reminders to display for this patient.   Lab Results  Component Value Date   TSH 2.280 10/13/2020   Lab Results  Component Value Date   WBC 5.8 03/12/2021   HGB 12.9 03/12/2021   HCT 37.3 03/12/2021   MCV 86 03/12/2021   PLT 305 03/12/2021   Lab Results  Component Value Date   NA 137 03/12/2021   K 3.8 03/12/2021   CO2 23 03/12/2021   GLUCOSE 103 (H) 03/12/2021   BUN 10 03/12/2021   CREATININE 0.74 03/12/2021   BILITOT 0.3 03/12/2021   ALKPHOS 175 (H) 03/12/2021   AST 17 03/12/2021   ALT 34 (H) 03/12/2021   PROT 7.0 03/12/2021   ALBUMIN 4.3 03/12/2021   CALCIUM 9.3 03/12/2021   ANIONGAP 10 03/25/2019   EGFR 100 03/12/2021   Lab Results  Component Value Date   CHOL 178 03/04/2021   Lab Results  Component Value Date   HDL 58 03/04/2021   Lab Results  Component Value Date   LDLCALC 104 (H) 03/04/2021   Lab Results  Component Value Date   TRIG 90 03/04/2021   Lab Results  Component Value Date   CHOLHDL 4.5 12/03/2019   Lab Results  Component Value Date   HGBA1C 5.4 06/02/2020   HGBA1C 5.4 06/02/2020   HGBA1C 5.4 (A) 06/02/2020   HGBA1C 5.4 06/02/2020       Assessment & Plan:   Problem List Items Addressed This Visit   None Visit Diagnoses     Suspected COVID-19 virus infection    -  Primary Check COVID RT-PCR Symptoms have been present for about a week now Symptomatic treatment for now   Relevant Medications   promethazine-dextromethorphan (PROMETHAZINE-DM) 6.25-15 MG/5ML syrup   Other Relevant Orders   Novel Coronavirus, NAA ([facility])    Acute bronchitis, unspecified organism   Could be postinfectious cough and/or acute bronchitis Steroid taper prescribed Promethazine-dextromethorphan PRN for cough      Relevant Medications   predniSONE (STERAPRED UNI-PAK 21 TAB) 10 MG (21) TBPK tablet   promethazine-dextromethorphan  (PROMETHAZINE-DM) 6.25-15 MG/5ML syrup        Meds ordered this encounter  Medications   predniSONE (STERAPRED UNI-PAK 21 TAB) 10 MG (21) TBPK tablet    Sig: Take as package instructions.    Dispense:  1 each    Refill:  0   promethazine-dextromethorphan (PROMETHAZINE-DM) 6.25-15 MG/5ML syrup    Sig: Take 5 mLs by mouth 4 (four) times daily as needed for cough.    Dispense:  118 mL    Refill:  0     Dazha Kempa Keith Rake, MD

## 2021-05-21 LAB — SARS-COV-2, NAA 2 DAY TAT

## 2021-05-21 LAB — NOVEL CORONAVIRUS, NAA: SARS-CoV-2, NAA: NOT DETECTED

## 2021-06-08 ENCOUNTER — Encounter: Payer: PRIVATE HEALTH INSURANCE | Admitting: Nurse Practitioner

## 2021-06-08 ENCOUNTER — Encounter: Payer: PRIVATE HEALTH INSURANCE | Admitting: Family Medicine

## 2021-07-06 ENCOUNTER — Other Ambulatory Visit: Payer: Self-pay

## 2021-07-06 DIAGNOSIS — F320 Major depressive disorder, single episode, mild: Secondary | ICD-10-CM

## 2021-07-06 MED ORDER — SERTRALINE HCL 50 MG PO TABS: ORAL_TABLET | ORAL | 2 refills | Status: DC

## 2021-08-16 ENCOUNTER — Other Ambulatory Visit: Payer: Self-pay | Admitting: *Deleted

## 2021-08-16 ENCOUNTER — Telehealth: Payer: Self-pay | Admitting: Nurse Practitioner

## 2021-08-16 DIAGNOSIS — F320 Major depressive disorder, single episode, mild: Secondary | ICD-10-CM

## 2021-08-16 MED ORDER — SERTRALINE HCL 50 MG PO TABS: ORAL_TABLET | ORAL | 0 refills | Status: DC

## 2021-08-16 NOTE — Telephone Encounter (Signed)
Rx was sent into pharmacy for 90 day supply

## 2021-08-16 NOTE — Telephone Encounter (Signed)
Pt called in for refills on Zoloft  Pt also has requested a 90 day supply , instead of the 30 day supply

## 2021-08-18 ENCOUNTER — Other Ambulatory Visit: Payer: Self-pay | Admitting: Nurse Practitioner

## 2021-08-18 DIAGNOSIS — F320 Major depressive disorder, single episode, mild: Secondary | ICD-10-CM

## 2021-09-20 ENCOUNTER — Other Ambulatory Visit: Payer: Self-pay

## 2021-09-20 ENCOUNTER — Ambulatory Visit: Payer: No Typology Code available for payment source

## 2021-09-20 ENCOUNTER — Ambulatory Visit (INDEPENDENT_AMBULATORY_CARE_PROVIDER_SITE_OTHER): Payer: No Typology Code available for payment source | Admitting: Nurse Practitioner

## 2021-09-20 ENCOUNTER — Encounter: Payer: Self-pay | Admitting: Nurse Practitioner

## 2021-09-20 DIAGNOSIS — J069 Acute upper respiratory infection, unspecified: Secondary | ICD-10-CM

## 2021-09-20 LAB — POCT INFLUENZA A/B
Influenza A, POC: NEGATIVE
Influenza B, POC: NEGATIVE

## 2021-09-20 MED ORDER — NOREL AD 4-10-325 MG PO TABS: 1.0000 | ORAL_TABLET | ORAL | 1 refills | Status: DC | PRN

## 2021-09-20 MED ORDER — BENZONATATE 100 MG PO CAPS: 100.0000 mg | ORAL_CAPSULE | Freq: Two times a day (BID) | ORAL | 0 refills | Status: DC | PRN

## 2021-09-20 NOTE — Assessment & Plan Note (Signed)
-  swab for flu and covid -Rx. norel and tessalon perles -will consider antivirals based on labs

## 2021-09-20 NOTE — Progress Notes (Addendum)
Acute Office Visit  Subjective:    Patient ID: Kelly Buchanan, female    DOB: 1971-09-26, 50 y.o.   MRN: [id number]  Chief Complaint  Patient presents with   Follow-up    Scratchy throat headache runny nose no fever x 2 days.    HPI Patient is in today for sick visit. Symptoms started yesterday. She has taken some mucinex. Kelly Buchanan has been sick, and she kept him this weekend.  Past Medical History:  Diagnosis Date   Chest heaviness 12/31/2019   Hyperhidrosis of axilla 09/06/2016   Hyperlipidemia    Murmur     Past Surgical History:  Procedure Laterality Date   APPENDECTOMY  41's   TONSILLECTOMY  1999   TUBAL LIGATION  1997    Family History  Problem Relation Age of Onset   Hypertension Mother    Cancer Mother 35       rectal CA    Heart disease Father 88       died at 2   Heart attack Father    Hypertension Father    Alcohol abuse Father    Cancer Sister        hodgkins   Hypertension Sister    Obesity Sister    Cancer Maternal Aunt     Social History   Socioeconomic History   Marital status: Single    Spouse name: Not on file   Number of children: 4   Years of education: 14   Highest education level: Some college, no degree  Occupational History   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[employer]  Tobacco Use   Smoking status: Never   Smokeless tobacco: Never  Substance and Sexual Activity   Alcohol use: No    Alcohol/week: 0.0 standard drinks   Drug use: No   Sexual activity: Not Currently    Birth control/protection: Surgical  Other Topics Concern   Not on file  Social History Narrative   Lives with Mother   Has four children (girls)   Kelly Buchanan [place] 25      Engineer, 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   Caffeine: 2 cups coffee mountain dew 20 oz every other day   Water: 1/2 gallon but rare      Wears seat belt   Does  not wear sunscreens   Smoke detectors at home   Does not text and drive   Social Determinants of Radio broadcast assistant Strain: Not on file  Food Insecurity: Not on file  Transportation Needs: Not on file  Physical Activity: Not on file  Stress: Not on file  Social Connections: Not on file  Intimate Partner Violence: Not on file    Outpatient Medications Prior to Visit  Medication Sig Dispense Refill   acetaminophen (TYLENOL) 500 MG tablet Take 1,000 mg by mouth every 6 (six) hours as needed.     atorvastatin (LIPITOR) 40 MG tablet Take 1 tablet (40 mg total) by mouth daily. 90 tablet 3   ibuprofen (ADVIL) 600 MG tablet Take 1 tablet (600 mg total) by mouth every 8 (eight) hours as needed for headache, mild pain or moderate pain. 30 tablet 0   Multiple Vitamin (MULTIVITAMIN WITH MINERALS) TABS tablet Take 1 tablet by mouth daily.     oxybutynin (DITROPAN XL) 10 MG 24 hr tablet  Take 1 tablet (10 mg total) by mouth at bedtime. 30 tablet 1   predniSONE (STERAPRED UNI-PAK 21 TAB) 10 MG (21) TBPK tablet Take as package instructions. 1 each 0   promethazine-dextromethorphan (PROMETHAZINE-DM) 6.25-15 MG/5ML syrup Take 5 mLs by mouth 4 (four) times daily as needed for cough. 118 mL 0   sertraline (ZOLOFT) 50 MG tablet TAKE 1 TABLET(50 MG) BY MOUTH DAILY 90 tablet 0   No facility-administered medications prior to visit.    No Known Allergies  Review of Systems  Constitutional:  Negative for chills, fatigue and fever.  HENT:  Positive for congestion, rhinorrhea, sinus pressure and sore throat. Negative for sinus pain.   Respiratory:  Positive for cough. Negative for shortness of breath and wheezing.   Neurological:  Positive for headaches.      Objective:    Physical Exam  There were no vitals taken for this visit. Wt Readings from Last 3 Encounters:  05/19/21 189 lb 0.6 oz (85.7 kg)  03/04/21 183 lb (83 kg)  12/01/20 182 lb (82.6 kg)    Health Maintenance Due  Topic Date  Due   PAP SMEAR-Modifier  Never done   COLONOSCOPY (Pts 45-31yrs Insurance coverage will need to be confirmed)  Never done   COVID-19 Vaccine (3 - Booster for Moderna series) 08/06/2020   INFLUENZA VACCINE  05/03/2021    There are no preventive care reminders to display for this patient.   Lab Results  Component Value Date   TSH 2.280 10/13/2020   Lab Results  Component Value Date   WBC 5.8 03/12/2021   HGB 12.9 03/12/2021   HCT 37.3 03/12/2021   MCV 86 03/12/2021   PLT 305 03/12/2021   Lab Results  Component Value Date   NA 137 03/12/2021   K 3.8 03/12/2021   CO2 23 03/12/2021   GLUCOSE 103 (H) 03/12/2021   BUN 10 03/12/2021   CREATININE 0.74 03/12/2021   BILITOT 0.3 03/12/2021   ALKPHOS 175 (H) 03/12/2021   AST 17 03/12/2021   ALT 34 (H) 03/12/2021   PROT 7.0 03/12/2021   ALBUMIN 4.3 03/12/2021   CALCIUM 9.3 03/12/2021   ANIONGAP 10 03/25/2019   EGFR 100 03/12/2021   Lab Results  Component Value Date   CHOL 178 03/04/2021   Lab Results  Component Value Date   HDL 58 03/04/2021   Lab Results  Component Value Date   LDLCALC 104 (H) 03/04/2021   Lab Results  Component Value Date   TRIG 90 03/04/2021   Lab Results  Component Value Date   CHOLHDL 4.5 12/03/2019   Lab Results  Component Value Date   HGBA1C 5.4 06/02/2020   HGBA1C 5.4 06/02/2020   HGBA1C 5.4 (A) 06/02/2020   HGBA1C 5.4 06/02/2020       Assessment & Plan:   Problem List Items Addressed This Visit       Respiratory   Upper respiratory tract infection - Primary    -swab for flu and covid -Rx. norel and tessalon perles -will consider antivirals based on labs      Relevant Medications   benzonatate (TESSALON) 100 MG capsule   Chlorphen-PE-Acetaminophen (NOREL AD) 4-10-325 MG TABS   Other Relevant Orders   Novel Coronavirus, NAA ([facility])     Meds ordered this encounter  Medications   benzonatate (TESSALON) 100 MG capsule    Sig: Take 1 capsule (100 mg total) by mouth 2  (two) times daily as needed for cough.    Dispense:  20 capsule    Refill:  0   Chlorphen-PE-Acetaminophen (NOREL AD) 4-10-325 MG TABS    Sig: Take 1 tablet by mouth every 4 (four) hours as needed (nasal congestion, cold symptoms).    Dispense:  20 tablet    Refill:  1   Date:  09/20/21    Location of Patient: Home Location of Provider: Office Consent was obtain for visit to be over via telehealth. I verified that I am speaking with the correct person using two identifiers.  I connected with  MARLEAH BEEVER on 09/20/21 via telephone and verified that I am speaking with the correct person using two identifiers.   I discussed the limitations of evaluation and management by telemedicine. The patient expressed understanding and agreed to proceed.  Time spent: 6 min    Noreene Larsson, NP

## 2021-09-20 NOTE — Addendum Note (Signed)
Addended by: Quentin Angst on: 09/20/2021 01:40 PM   Modules accepted: Orders

## 2021-09-20 NOTE — Progress Notes (Signed)
Flu is negative. COVID will result tomorrow or the day after.

## 2021-09-22 LAB — NOVEL CORONAVIRUS, NAA: SARS-CoV-2, NAA: NOT DETECTED

## 2021-09-22 LAB — SARS-COV-2, NAA 2 DAY TAT

## 2021-09-22 NOTE — Progress Notes (Signed)
COVID is negative.

## 2021-09-29 ENCOUNTER — Other Ambulatory Visit: Payer: Self-pay

## 2021-09-29 ENCOUNTER — Encounter: Payer: Self-pay | Admitting: Nurse Practitioner

## 2021-09-29 ENCOUNTER — Telehealth (INDEPENDENT_AMBULATORY_CARE_PROVIDER_SITE_OTHER): Payer: PRIVATE HEALTH INSURANCE | Admitting: Nurse Practitioner

## 2021-09-29 DIAGNOSIS — Z20822 Contact with and (suspected) exposure to covid-19: Secondary | ICD-10-CM

## 2021-09-29 DIAGNOSIS — J329 Chronic sinusitis, unspecified: Secondary | ICD-10-CM | POA: Insufficient documentation

## 2021-09-29 DIAGNOSIS — J209 Acute bronchitis, unspecified: Secondary | ICD-10-CM

## 2021-09-29 DIAGNOSIS — J0100 Acute maxillary sinusitis, unspecified: Secondary | ICD-10-CM

## 2021-09-29 MED ORDER — PROMETHAZINE-DM 6.25-15 MG/5ML PO SYRP: 5.0000 mL | ORAL_SOLUTION | Freq: Four times a day (QID) | ORAL | 0 refills | Status: DC | PRN

## 2021-09-29 MED ORDER — AMOXICILLIN-POT CLAVULANATE 875-125 MG PO TABS: 1.0000 | ORAL_TABLET | Freq: Two times a day (BID) | ORAL | 0 refills | Status: DC

## 2021-09-29 NOTE — Assessment & Plan Note (Signed)
-  Rx. augmentin and promethazine-DM -still having symptoms from 09/20/21

## 2021-09-29 NOTE — Progress Notes (Signed)
Acute Office Visit  Subjective:    Patient ID: Kelly Buchanan, female    DOB: Jun 13, 1971, 50 y.o.   MRN: [id number]  Chief Complaint  Patient presents with   Sore Throat    Congestion,sore throat,x 1 week     Sore Throat  Associated symptoms include congestion and coughing. Pertinent negatives include no shortness of breath.  Patient is in today for sick visit. She is having sinus pressure and lost the sense of taste and smell.  Past Medical History:  Diagnosis Date   Chest heaviness 12/31/2019   Hyperhidrosis of axilla 09/06/2016   Hyperlipidemia    Murmur     Past Surgical History:  Procedure Laterality Date   APPENDECTOMY  58's   TONSILLECTOMY  1999   TUBAL LIGATION  1997    Family History  Problem Relation Age of Onset   Hypertension Mother    Cancer Mother 31       rectal CA    Heart disease Father 1       died at 35   Heart attack Father    Hypertension Father    Alcohol abuse Father    Cancer Sister        hodgkins   Hypertension Sister    Obesity Sister    Cancer Maternal Aunt     Social History   Socioeconomic History   Marital status: Single    Spouse name: Not on file   Number of children: 4   Years of education: 14   Highest education level: Some college, no degree  Occupational History   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[employer]  Tobacco Use   Smoking status: Never   Smokeless tobacco: Never  Substance and Sexual Activity   Alcohol use: No    Alcohol/week: 0.0 standard drinks   Drug use: No   Sexual activity: Not Currently    Birth control/protection: Surgical  Other Topics Concern   Not on file  Social History Narrative   Lives with Mother   Has four children (girls)   [place] 25      Engineer, 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   Caffeine: 2 cups coffee mountain dew 20 oz every other day   Water:  1/2 gallon but rare      Wears seat belt   Does not wear sunscreens   Smoke detectors at home   Does not text and drive   Social Determinants of Radio broadcast assistant Strain: Not on file  Food Insecurity: Not on file  Transportation Needs: Not on file  Physical Activity: Not on file  Stress: Not on file  Social Connections: Not on file  Intimate Partner Violence: Not on file    Outpatient Medications Prior to Visit  Medication Sig Dispense Refill   acetaminophen (TYLENOL) 500 MG tablet Take 1,000 mg by mouth every 6 (six) hours as needed.     atorvastatin (LIPITOR) 40 MG tablet Take 1 tablet (40 mg total) by mouth daily. 90 tablet 3   Chlorphen-PE-Acetaminophen (NOREL AD) 4-10-325 MG TABS Take 1 tablet by mouth every 4 (four) hours as needed (nasal congestion, cold symptoms). 20 tablet 1   ibuprofen (ADVIL) 600 MG tablet Take 1 tablet (600 mg total) by mouth every 8 (eight) hours as needed for headache, mild pain  or moderate pain. 30 tablet 0   Multiple Vitamin (MULTIVITAMIN WITH MINERALS) TABS tablet Take 1 tablet by mouth daily.     oxybutynin (DITROPAN XL) 10 MG 24 hr tablet Take 1 tablet (10 mg total) by mouth at bedtime. 30 tablet 1   sertraline (ZOLOFT) 50 MG tablet TAKE 1 TABLET(50 MG) BY MOUTH DAILY 90 tablet 0   benzonatate (TESSALON) 100 MG capsule Take 1 capsule (100 mg total) by mouth 2 (two) times daily as needed for cough. 20 capsule 0   predniSONE (STERAPRED UNI-PAK 21 TAB) 10 MG (21) TBPK tablet Take as package instructions. 1 each 0   promethazine-dextromethorphan (PROMETHAZINE-DM) 6.25-15 MG/5ML syrup Take 5 mLs by mouth 4 (four) times daily as needed for cough. 118 mL 0   No facility-administered medications prior to visit.    No Known Allergies  Review of Systems  Constitutional:  Positive for fatigue. Negative for chills and fever.  HENT:  Positive for congestion, nosebleeds and sore throat.   Respiratory:  Positive for cough. Negative for shortness  of breath and wheezing.       Objective:    Physical Exam  There were no vitals taken for this visit. Wt Readings from Last 3 Encounters:  05/19/21 189 lb 0.6 oz (85.7 kg)  03/04/21 183 lb (83 kg)  12/01/20 182 lb (82.6 kg)    Health Maintenance Due  Topic Date Due   PAP SMEAR-Modifier  Never done   COLONOSCOPY (Pts 45-1yrs Insurance coverage will need to be confirmed)  Never done   COVID-19 Vaccine (3 - Booster for Moderna series) 08/06/2020   INFLUENZA VACCINE  05/03/2021    There are no preventive care reminders to display for this patient.   Lab Results  Component Value Date   TSH 2.280 10/13/2020   Lab Results  Component Value Date   WBC 5.8 03/12/2021   HGB 12.9 03/12/2021   HCT 37.3 03/12/2021   MCV 86 03/12/2021   PLT 305 03/12/2021   Lab Results  Component Value Date   NA 137 03/12/2021   K 3.8 03/12/2021   CO2 23 03/12/2021   GLUCOSE 103 (H) 03/12/2021   BUN 10 03/12/2021   CREATININE 0.74 03/12/2021   BILITOT 0.3 03/12/2021   ALKPHOS 175 (H) 03/12/2021   AST 17 03/12/2021   ALT 34 (H) 03/12/2021   PROT 7.0 03/12/2021   ALBUMIN 4.3 03/12/2021   CALCIUM 9.3 03/12/2021   ANIONGAP 10 03/25/2019   EGFR 100 03/12/2021   Lab Results  Component Value Date   CHOL 178 03/04/2021   Lab Results  Component Value Date   HDL 58 03/04/2021   Lab Results  Component Value Date   LDLCALC 104 (H) 03/04/2021   Lab Results  Component Value Date   TRIG 90 03/04/2021   Lab Results  Component Value Date   CHOLHDL 4.5 12/03/2019   Lab Results  Component Value Date   HGBA1C 5.4 06/02/2020   HGBA1C 5.4 06/02/2020   HGBA1C 5.4 (A) 06/02/2020   HGBA1C 5.4 06/02/2020       Assessment & Plan:   Problem List Items Addressed This Visit       Respiratory   Sinusitis    -Rx. augmentin and promethazine-DM -still having symptoms from 09/20/21      Relevant Medications   amoxicillin-clavulanate (AUGMENTIN) 875-125 MG tablet    promethazine-dextromethorphan (PROMETHAZINE-DM) 6.25-15 MG/5ML syrup   Other Visit Diagnoses     Suspected COVID-19 virus infection  Relevant Medications   promethazine-dextromethorphan (PROMETHAZINE-DM) 6.25-15 MG/5ML syrup   Acute bronchitis, unspecified organism       Relevant Medications   promethazine-dextromethorphan (PROMETHAZINE-DM) 6.25-15 MG/5ML syrup        Meds ordered this encounter  Medications   amoxicillin-clavulanate (AUGMENTIN) 875-125 MG tablet    Sig: Take 1 tablet by mouth 2 (two) times daily.    Dispense:  14 tablet    Refill:  0   promethazine-dextromethorphan (PROMETHAZINE-DM) 6.25-15 MG/5ML syrup    Sig: Take 5 mLs by mouth 4 (four) times daily as needed for cough.    Dispense:  118 mL    Refill:  0   Date:  09/29/2021   Location of Patient: Home Location of Provider: Office Consent was obtain for visit to be over via telehealth. I verified that I am speaking with the correct person using two identifiers.  I connected with  DESIRE FULP on 09/29/21 via telephone and verified that I am speaking with the correct person using two identifiers.   I discussed the limitations of evaluation and management by telemedicine. The patient expressed understanding and agreed to proceed.  Time spent:8 min    Noreene Larsson, NP

## 2021-10-01 ENCOUNTER — Encounter: Payer: Self-pay | Admitting: Nurse Practitioner

## 2021-10-01 ENCOUNTER — Other Ambulatory Visit: Payer: Self-pay | Admitting: Nurse Practitioner

## 2021-10-01 MED ORDER — BACITRACIN-POLYMYXIN B 500-10000 UNIT/GM OP OINT: 1.0000 "application " | TOPICAL_OINTMENT | Freq: Two times a day (BID) | OPHTHALMIC | 0 refills | Status: DC

## 2021-10-01 NOTE — Telephone Encounter (Signed)
I sent in ophthalmic polysporin. If it isn't much bette rover the weekend, she will need to come into the office.

## 2021-10-06 ENCOUNTER — Other Ambulatory Visit: Payer: Self-pay | Admitting: Internal Medicine

## 2021-10-06 ENCOUNTER — Encounter: Payer: Self-pay | Admitting: Internal Medicine

## 2021-10-06 DIAGNOSIS — J309 Allergic rhinitis, unspecified: Secondary | ICD-10-CM

## 2021-10-06 MED ORDER — FLUTICASONE PROPIONATE 50 MCG/ACT NA SUSP: 2.0000 | Freq: Every day | NASAL | 2 refills | Status: DC

## 2021-11-16 ENCOUNTER — Other Ambulatory Visit: Payer: Self-pay

## 2021-11-16 MED ORDER — ATORVASTATIN CALCIUM 40 MG PO TABS: 40.0000 mg | ORAL_TABLET | Freq: Every day | ORAL | 3 refills | Status: DC

## 2022-01-30 ENCOUNTER — Encounter: Payer: Self-pay | Admitting: Internal Medicine

## 2022-01-31 ENCOUNTER — Other Ambulatory Visit: Payer: Self-pay | Admitting: Internal Medicine

## 2022-01-31 DIAGNOSIS — F320 Major depressive disorder, single episode, mild: Secondary | ICD-10-CM

## 2022-01-31 MED ORDER — SERTRALINE HCL 50 MG PO TABS: 50.0000 mg | ORAL_TABLET | Freq: Every day | ORAL | 1 refills | Status: DC

## 2022-02-01 ENCOUNTER — Ambulatory Visit (INDEPENDENT_AMBULATORY_CARE_PROVIDER_SITE_OTHER): Payer: PRIVATE HEALTH INSURANCE | Admitting: Internal Medicine

## 2022-02-01 ENCOUNTER — Encounter: Payer: Self-pay | Admitting: Internal Medicine

## 2022-02-01 ENCOUNTER — Encounter (INDEPENDENT_AMBULATORY_CARE_PROVIDER_SITE_OTHER): Payer: Self-pay | Admitting: *Deleted

## 2022-02-01 VITALS — BP 136/88 | HR 81 | Resp 18 | Ht 67.0 in | Wt 192.2 lb

## 2022-02-01 DIAGNOSIS — G5602 Carpal tunnel syndrome, left upper limb: Secondary | ICD-10-CM | POA: Diagnosis not present

## 2022-02-01 DIAGNOSIS — F3341 Major depressive disorder, recurrent, in partial remission: Secondary | ICD-10-CM

## 2022-02-01 DIAGNOSIS — Z124 Encounter for screening for malignant neoplasm of cervix: Secondary | ICD-10-CM

## 2022-02-01 DIAGNOSIS — Z1211 Encounter for screening for malignant neoplasm of colon: Secondary | ICD-10-CM

## 2022-02-01 DIAGNOSIS — N939 Abnormal uterine and vaginal bleeding, unspecified: Secondary | ICD-10-CM

## 2022-02-01 DIAGNOSIS — K2950 Unspecified chronic gastritis without bleeding: Secondary | ICD-10-CM | POA: Diagnosis not present

## 2022-02-01 DIAGNOSIS — E782 Mixed hyperlipidemia: Secondary | ICD-10-CM

## 2022-02-01 DIAGNOSIS — E559 Vitamin D deficiency, unspecified: Secondary | ICD-10-CM

## 2022-02-01 DIAGNOSIS — Z0000 Encounter for general adult medical examination without abnormal findings: Secondary | ICD-10-CM

## 2022-02-01 MED ORDER — OMEPRAZOLE 20 MG PO CPDR: 20.0000 mg | DELAYED_RELEASE_CAPSULE | Freq: Every day | ORAL | 3 refills | Status: DC

## 2022-02-01 NOTE — Assessment & Plan Note (Signed)
Bloating and chronic nausea likely due to chronic gastritis ?Trial of omeprazole for now ?

## 2022-02-01 NOTE — Assessment & Plan Note (Signed)
Lipid profile reviewed last blood work Continue atorvastatin for now Check lipid profile 

## 2022-02-01 NOTE — Assessment & Plan Note (Signed)
Well-controlled with Zoloft °

## 2022-02-01 NOTE — Assessment & Plan Note (Addendum)
Her hand symptoms could be due to carpal tunnel syndrome ?Although she also has symptoms in her left forearm, could be neuropathy pain ?Will get orthopedic surgery eval ?If persistent, can give trial of gabapentin ?

## 2022-02-01 NOTE — Progress Notes (Signed)
? ?Established Patient Office Visit ? ?Subjective:  ?Patient ID: Kelly Buchanan, female    DOB: 04-09-1971  Age: 51 y.o. MRN: [id number] ? ?CC:  ?Chief Complaint  ?Patient presents with  ? Follow-up  ?  Pt has had left arm numbness and tingling down to hand sometimes shoots up arm and feels cold this has been going on for 3 months off and on also pt wanted to know if their is lab testing for pre menopause cycle has been happening twice a month  ? ? ?HPI ?Kelly Buchanan is a 51 y.o. female with past medical history of HLD, OA, depression and GAD who presents for f/u of her chronic medical conditions. ? ?MDD with GAD: She takes Zoloft for it.  She denies any anhedonia, SI or HI currently.  She had run out off of Zoloft and was having mild mood symptoms without it. ? ?She complains of left hand numbness and cold sensation at times.  She also has shooting pain/tingling times in her forearm.  She denies any recent injury or fall.  She does have to work for long times at [employer]. ? ?She also complains of abnormal uterine bleeding, about twice a month for the last 2 months, which have been heavier as well.  Denies any vaginal discharge currently.  Denies any perineal area itching, flank pain or pelvic pain currently. ? ?She also complains of bloating and chronic nausea.  She has tried OTC Prilosec with some relief in the past.  She denies any dysphagia or odynophagia currently. ? ?Past Medical History:  ?Diagnosis Date  ? Chest heaviness 12/31/2019  ? Hyperhidrosis of axilla 09/06/2016  ? Hyperlipidemia   ? Murmur   ? ? ?Past Surgical History:  ?Procedure Laterality Date  ? APPENDECTOMY  1980's  ? TONSILLECTOMY  1999  ? TUBAL LIGATION  1997  ? ? ?Family History  ?Problem Relation Age of Onset  ? Hypertension Mother   ? Cancer Mother 23  ?     rectal CA   ? Heart disease Father 42  ?     died at 63  ? Heart attack Father   ? Hypertension Father   ? Alcohol abuse Father   ? Cancer Sister   ?     hodgkins  ? Hypertension  Sister   ? Obesity Sister   ? Cancer Maternal Aunt   ? ? ?Social History  ? ?Socioeconomic History  ? Marital status: Single  ?  Spouse name: Not on file  ? Number of children: 4  ? Years of education: 38  ? Highest education level: Some college, no degree  ?Occupational History  ? Occupation: Engineer, structural  ?  Employer: [employer]  ?Tobacco Use  ? Smoking status: Never  ? Smokeless tobacco: Never  ?Substance and Sexual Activity  ? Alcohol use: No  ?  Alcohol/week: 0.0 standard drinks  ? Drug use: No  ? Sexual activity: Not Currently  ?  Birth control/protection: Surgical  ?Other Topics Concern  ? Not on file  ?Social History Narrative  ? Lives with Mother  ? Has four children (girls)  ? [place] 28  ? Darnelle Bos 26  ? Lily Peer 25  ? [employer] 24  ?   ? Engineer, structural at [employer]   ?   ? Diet: overall well but likes to snack  ? Caffeine: 2 cups coffee mountain dew 20 oz every other day  ? Water: 1/2 gallon but  rare  ?   ? Wears seat belt  ? Does not wear sunscreens  ? Smoke detectors at home  ? Does not text and drive  ? ?Social Determinants of Health  ? ?Financial Resource Strain: Not on file  ?Food Insecurity: Not on file  ?Transportation Needs: Not on file  ?Physical Activity: Not on file  ?Stress: Not on file  ?Social Connections: Not on file  ?Intimate Partner Violence: Not on file  ? ? ?Outpatient Medications Prior to Visit  ?Medication Sig Dispense Refill  ? acetaminophen (TYLENOL) 500 MG tablet Take 1,000 mg by mouth every 6 (six) hours as needed.    ? atorvastatin (LIPITOR) 40 MG tablet Take 1 tablet (40 mg total) by mouth daily. 90 tablet 3  ? fluticasone (FLONASE) 50 MCG/ACT nasal spray Place 2 sprays into both nostrils daily. 16 g 2  ? ibuprofen (ADVIL) 600 MG tablet Take 1 tablet (600 mg total) by mouth every 8 (eight) hours as needed for headache, mild pain or moderate pain. 30 tablet 0  ? Multiple Vitamin (MULTIVITAMIN WITH MINERALS) TABS tablet Take 1 tablet by  mouth daily.    ? sertraline (ZOLOFT) 50 MG tablet Take 1 tablet (50 mg total) by mouth daily. 90 tablet 1  ? amoxicillin-clavulanate (AUGMENTIN) 875-125 MG tablet Take 1 tablet by mouth 2 (two) times daily. 14 tablet 0  ? bacitracin-polymyxin b (POLYSPORIN) ophthalmic ointment Place 1 application into the left eye every 12 (twelve) hours. apply to eye every 12 hours while awake 3.5 g 0  ? Chlorphen-PE-Acetaminophen (NOREL AD) 4-10-325 MG TABS Take 1 tablet by mouth every 4 (four) hours as needed (nasal congestion, cold symptoms). 20 tablet 1  ? oxybutynin (DITROPAN XL) 10 MG 24 hr tablet Take 1 tablet (10 mg total) by mouth at bedtime. 30 tablet 1  ? promethazine-dextromethorphan (PROMETHAZINE-DM) 6.25-15 MG/5ML syrup Take 5 mLs by mouth 4 (four) times daily as needed for cough. 118 mL 0  ? ?No facility-administered medications prior to visit.  ? ? ?No Known Allergies ? ?ROS ?Review of Systems  ?Constitutional:  Negative for chills and fever.  ?HENT:  Negative for congestion, sinus pressure, sinus pain and sore throat.   ?Eyes:  Negative for pain and discharge.  ?Respiratory:  Negative for cough and shortness of breath.   ?Cardiovascular:  Negative for chest pain and palpitations.  ?Gastrointestinal:  Negative for abdominal pain, diarrhea, nausea and vomiting.  ?Endocrine: Negative for polydipsia and polyuria.  ?Genitourinary:  Positive for menstrual problem. Negative for dysuria and hematuria.  ?Musculoskeletal:  Negative for neck pain and neck stiffness.  ?Skin:  Negative for rash.  ?Neurological:  Positive for numbness. Negative for dizziness and weakness.  ?Psychiatric/Behavioral:  Negative for agitation and behavioral problems.   ? ?  ?Objective:  ?  ?Physical Exam ?Vitals reviewed.  ?Constitutional:   ?   General: She is not in acute distress. ?   Appearance: She is not diaphoretic.  ?HENT:  ?   Head: Normocephalic and atraumatic.  ?   Nose: Nose normal. No congestion.  ?   Mouth/Throat:  ?   Mouth: Mucous  membranes are moist.  ?   Pharynx: No posterior oropharyngeal erythema.  ?Eyes:  ?   General: No scleral icterus. ?   Extraocular Movements: Extraocular movements intact.  ?Cardiovascular:  ?   Rate and Rhythm: Normal rate and regular rhythm.  ?   Pulses: Normal pulses.  ?   Heart sounds: Normal heart sounds. No murmur heard. ?  Pulmonary:  ?   Breath sounds: Normal breath sounds. No wheezing or rales.  ?Abdominal:  ?   Palpations: Abdomen is soft.  ?   Tenderness: There is no abdominal tenderness.  ?Musculoskeletal:  ?   Cervical back: Neck supple. No tenderness.  ?   Right lower leg: No edema.  ?   Left lower leg: No edema.  ?Skin: ?   General: Skin is warm.  ?   Findings: No rash.  ?Neurological:  ?   General: No focal deficit present.  ?   Mental Status: She is alert and oriented to person, place, and time.  ?Psychiatric:     ?   Mood and Affect: Mood normal.     ?   Behavior: Behavior normal.  ? ? ?BP 136/88 (BP Location: Left Arm, Patient Position: Sitting, Cuff Size: Normal)   Pulse 81   Resp 18   Ht $R'5\' 7"'zt$  (1.702 m)   Wt 192 lb 3.2 oz (87.2 kg)   SpO2 97%   BMI 30.10 kg/m?  ?Wt Readings from Last 3 Encounters:  ?02/01/22 192 lb 3.2 oz (87.2 kg)  ?05/19/21 189 lb 0.6 oz (85.7 kg)  ?03/04/21 183 lb (83 kg)  ? ? ?Lab Results  ?Component Value Date  ? TSH 2.280 10/13/2020  ? ?Lab Results  ?Component Value Date  ? WBC 5.8 03/12/2021  ? HGB 12.9 03/12/2021  ? HCT 37.3 03/12/2021  ? MCV 86 03/12/2021  ? PLT 305 03/12/2021  ? ?Lab Results  ?Component Value Date  ? NA 137 03/12/2021  ? K 3.8 03/12/2021  ? CO2 23 03/12/2021  ? GLUCOSE 103 (H) 03/12/2021  ? BUN 10 03/12/2021  ? CREATININE 0.74 03/12/2021  ? BILITOT 0.3 03/12/2021  ? ALKPHOS 175 (H) 03/12/2021  ? AST 17 03/12/2021  ? ALT 34 (H) 03/12/2021  ? PROT 7.0 03/12/2021  ? ALBUMIN 4.3 03/12/2021  ? CALCIUM 9.3 03/12/2021  ? ANIONGAP 10 03/25/2019  ? EGFR 100 03/12/2021  ? ?Lab Results  ?Component Value Date  ? CHOL 178 03/04/2021  ? ?Lab Results  ?Component  Value Date  ? HDL 58 03/04/2021  ? ?Lab Results  ?Component Value Date  ? LDLCALC 104 (H) 03/04/2021  ? ?Lab Results  ?Component Value Date  ? TRIG 90 03/04/2021  ? ?Lab Results  ?Component Value Date  ? CHOLHD

## 2022-02-01 NOTE — Assessment & Plan Note (Signed)
Heavy, irregular menstrual bleeding ?Referred to OB/GYN for further eval ?

## 2022-02-01 NOTE — Patient Instructions (Addendum)
Please use wrist brace at nighttime to help with numbness/tingling sensation. ? ?You are being referred to Orthopedic surgery for hand numbness. ? ?Please take Omeprazole as prescribed. ?

## 2022-02-15 ENCOUNTER — Ambulatory Visit: Payer: PRIVATE HEALTH INSURANCE | Admitting: Orthopedic Surgery

## 2022-02-21 ENCOUNTER — Ambulatory Visit (INDEPENDENT_AMBULATORY_CARE_PROVIDER_SITE_OTHER): Payer: PRIVATE HEALTH INSURANCE | Admitting: Internal Medicine

## 2022-02-21 ENCOUNTER — Encounter: Payer: Self-pay | Admitting: Internal Medicine

## 2022-02-21 DIAGNOSIS — Z20822 Contact with and (suspected) exposure to covid-19: Secondary | ICD-10-CM

## 2022-02-21 DIAGNOSIS — J0110 Acute frontal sinusitis, unspecified: Secondary | ICD-10-CM | POA: Diagnosis not present

## 2022-02-21 MED ORDER — AZITHROMYCIN 250 MG PO TABS: ORAL_TABLET | ORAL | 0 refills | Status: AC

## 2022-02-21 NOTE — Addendum Note (Signed)
Addended byIhor Dow on: 02/21/2022 04:59 PM   Modules accepted: Orders

## 2022-02-21 NOTE — Progress Notes (Signed)
Virtual Visit via Telephone Note   This visit type was conducted due to national recommendations for restrictions regarding the COVID-19 Pandemic (e.g. social distancing) in an effort to limit this patient's exposure and mitigate transmission in our community.  Due to her co-morbid illnesses, this patient is at least at moderate risk for complications without adequate follow up.  This format is felt to be most appropriate for this patient at this time.  The patient did not have access to video technology/had technical difficulties with video requiring transitioning to audio format only (telephone).  All issues noted in this document were discussed and addressed.  No physical exam could be performed with this format.  Evaluation Performed:  Follow-up visit  Date:  02/21/2022   ID:  Kelly Buchanan, DOB May 01, 1971, MRN [id number]  Patient Location: Home Provider Location: Office/Clinic  Participants: Patient Location of Patient: Home Location of Provider: Telehealth Consent was obtain for visit to be over via telehealth. I verified that I am speaking with the correct person using two identifiers.  PCP:  Lindell Spar, MD   Chief Complaint: Nasal congestion, cough and headache  History of Present Illness:    Kelly Buchanan is a 51 y.o. female who has a televisit for c/o nasal congestion, cough and headache for the last 4 days. She feels sinus pressure and myalgias as well. She denies any dyspnea or wheezing. She has tried taking Ibuprofen and Afrin with no relief. Has not had COVID test yet.  The patient does have symptoms concerning for COVID-19 infection (fever, chills, cough, or new shortness of breath).   Past Medical, Surgical, Social History, Allergies, and Medications have been Reviewed.  Past Medical History:  Diagnosis Date   Chest heaviness 12/31/2019   Hyperhidrosis of axilla 09/06/2016   Hyperlipidemia    Murmur    Past Surgical History:  Procedure Laterality Date    APPENDECTOMY  1980's   TONSILLECTOMY  1999   TUBAL LIGATION  1997     Current Meds  Medication Sig   acetaminophen (TYLENOL) 500 MG tablet Take 1,000 mg by mouth every 6 (six) hours as needed.   atorvastatin (LIPITOR) 40 MG tablet Take 1 tablet (40 mg total) by mouth daily.   fluticasone (FLONASE) 50 MCG/ACT nasal spray Place 2 sprays into both nostrils daily.   ibuprofen (ADVIL) 600 MG tablet Take 1 tablet (600 mg total) by mouth every 8 (eight) hours as needed for headache, mild pain or moderate pain.   Multiple Vitamin (MULTIVITAMIN WITH MINERALS) TABS tablet Take 1 tablet by mouth daily.   omeprazole (PRILOSEC) 20 MG capsule Take 1 capsule (20 mg total) by mouth daily.   sertraline (ZOLOFT) 50 MG tablet Take 1 tablet (50 mg total) by mouth daily.     Allergies:   Patient has no known allergies.   ROS:   Please see the history of present illness.     All other systems reviewed and are negative.   Labs/Other Tests and Data Reviewed:    Recent Labs: 03/12/2021: ALT 34; BUN 10; Creatinine, Ser 0.74; Hemoglobin 12.9; Platelets 305; Potassium 3.8; Sodium 137   Recent Lipid Panel Lab Results  Component Value Date/Time   CHOL 178 03/04/2021 11:58 AM   TRIG 90 03/04/2021 11:58 AM   HDL 58 03/04/2021 11:58 AM   CHOLHDL 4.5 12/03/2019 11:45 AM   LDLCALC 104 (H) 03/04/2021 11:58 AM   LDLCALC 163 (H) 12/03/2019 11:45 AM    Wt Readings from Last  3 Encounters:  02/01/22 192 lb 3.2 oz (87.2 kg)  05/19/21 189 lb 0.6 oz (85.7 kg)  03/04/21 183 lb (83 kg)     ASSESSMENT & PLAN:    Acute sinusitis Suspected COVID-19 infection She has nasal congestion, mild yesterday sinus pressure related headache with cough x 4 days, could be viral URTI Advised to take home COVID test -if negative COVID test with persistent symptoms, will start antibiotic Nasal saline spray as needed for nasal congestion Mucinex or Robitussin as needed for cough Advised to use vaporizer or humidifier at  home    Time:   Today, I have spent 6 minutes reviewing the chart, including problem list, medications, and with the patient with telehealth technology discussing the above problems.   Medication Adjustments/Labs and Tests Ordered: Current medicines are reviewed at length with the patient today.  Concerns regarding medicines are outlined above.   Tests Ordered: No orders of the defined types were placed in this encounter.   Medication Changes: No orders of the defined types were placed in this encounter.    Note: This dictation was prepared with Dragon dictation along with smaller phrase technology. Similar sounding words can be transcribed inadequately or may not be corrected upon review. Any transcriptional errors that result from this process are unintentional.      Disposition:  Follow up  Signed, Lindell Spar, MD  02/21/2022 2:51 PM     [facility]

## 2022-03-03 ENCOUNTER — Other Ambulatory Visit (INDEPENDENT_AMBULATORY_CARE_PROVIDER_SITE_OTHER): Payer: Self-pay

## 2022-03-03 DIAGNOSIS — Z800 Family history of malignant neoplasm of digestive organs: Secondary | ICD-10-CM

## 2022-03-03 DIAGNOSIS — Z1211 Encounter for screening for malignant neoplasm of colon: Secondary | ICD-10-CM

## 2022-03-08 ENCOUNTER — Other Ambulatory Visit (HOSPITAL_COMMUNITY)
Admission: RE | Admit: 2022-03-08 | Discharge: 2022-03-08 | Disposition: A | Discharge status: Home / Self Care | Payer: PRIVATE HEALTH INSURANCE | Source: Ambulatory Visit | Attending: Obstetrics & Gynecology | Admitting: Obstetrics & Gynecology

## 2022-03-08 ENCOUNTER — Encounter: Payer: Self-pay | Admitting: Obstetrics & Gynecology

## 2022-03-08 ENCOUNTER — Ambulatory Visit (INDEPENDENT_AMBULATORY_CARE_PROVIDER_SITE_OTHER): Payer: PRIVATE HEALTH INSURANCE | Admitting: Obstetrics & Gynecology

## 2022-03-08 VITALS — BP 149/88 | HR 93 | Ht 67.0 in | Wt 192.6 lb

## 2022-03-08 DIAGNOSIS — N852 Hypertrophy of uterus: Secondary | ICD-10-CM | POA: Diagnosis not present

## 2022-03-08 DIAGNOSIS — Z1231 Encounter for screening mammogram for malignant neoplasm of breast: Secondary | ICD-10-CM | POA: Diagnosis not present

## 2022-03-08 DIAGNOSIS — Z01419 Encounter for gynecological examination (general) (routine) without abnormal findings: Secondary | ICD-10-CM

## 2022-03-08 NOTE — Patient Instructions (Signed)
Please schedule a mammogram at one of the following locations:  [place]: [phone]  [facility] [place]:[phone] [address]  

## 2022-03-08 NOTE — Progress Notes (Signed)
WELL-WOMAN EXAMINATION Patient name: Kelly Buchanan MRN [id number]  Date of birth: 1970-10-16 Chief Complaint:   Gynecologic Exam  History of Present Illness:   Kelly Buchanan is a 51 y.o. [phone] female being seen today for a routine well-woman exam.   Menses last about 6 days with moderate to heavy bleeding- super tampon every 2 hours. Some pelvic pressure, but tolerable.    Same partner x 11yr occasional odor during intercourse.   Patient's last menstrual period was 02/24/2022. Denies issues with her menses The current method of family planning is tubal ligation.    Last pap 3 yrs ago.  Last mammogram: ~89yrago. Last colonoscopy: scheduled for next month     03/08/2022    2:29 PM 02/21/2022    2:38 PM 02/01/2022   10:11 AM 09/29/2021    3:27 PM 09/20/2021    1:03 PM  Depression screen PHQ 2/9  Decreased Interest 0 0 0 0 0  Down, Depressed, Hopeless 0 0 0 0 0  PHQ - 2 Score 0 0 0 0 0  Altered sleeping 0 0 0    Tired, decreased energy 1 0 0    Change in appetite 0 0 0    Feeling bad or failure about yourself  0 0 0    Trouble concentrating 0 0 0    Moving slowly or fidgety/restless 0 0 0    Suicidal thoughts 0 0 0    PHQ-9 Score 1 0 0    Difficult doing work/chores  Not difficult at all Not difficult at all        Review of Systems:   Pertinent items are noted in HPI Denies any headaches, blurred vision, fatigue, shortness of breath, chest pain, abdominal pain, bowel movements, urination, or intercourse unless otherwise stated above.  Pertinent History Reviewed:  Reviewed past medical,surgical, social and family history.  Reviewed problem list, medications and allergies. Physical Assessment:   Vitals:   03/08/22 1422  BP: (!) 149/88  Pulse: 93  Weight: 192 lb 9.6 oz (87.4 kg)  Height: '5\' 7"'$  (1.702 m)  Body mass index is 30.17 kg/m.        Physical Examination:   General appearance - well appearing, and in no distress  Mental status - alert, oriented to  person, place, and time  Psych:  She has a normal mood and affect  Skin - warm and dry, normal color, no suspicious lesions noted  Chest - effort normal, all lung fields clear to auscultation bilaterally  Heart - normal rate and regular rhythm  Neck:  midline trachea, no thyromegaly or nodules  Breasts - breasts appear normal, no suspicious masses, no skin or nipple changes or  axillary nodes  Abdomen - soft, nontender, nondistended, no masses or organomegaly  Pelvic - VULVA: normal appearing vulva with no masses, tenderness or lesions  VAGINA: normal appearing vagina with normal color and discharge, no lesions  CERVIX: normal appearing cervix without discharge or lesions, no CMT  Thin prep pap is done with HR HPV cotesting  UTERUS: enlarged, mobile, non-tender, mostly on the right side- concern of uterine fibroid vs adnexal mass  Extremities:  No swelling or varicosities noted  Chaperone: [facility]selling psychologist   Assessment & Plan:  1) Well-Woman Exam -pap collected reviewed screening guidelines -mammogram ordered  2) Enlarged uterus -discussed concerns for irregular enlarged uterus vs possible adnexal mass -pelvic USKoreat next available, pending results will discuss management options   Follow-up: Return in  about 1 year (around 03/09/2023) for Annual, please print AVS, please schedule pelvic US in our office .   Janyth Pupa, DO Attending [place], [facility] for [employer], [place]

## 2022-03-11 LAB — CYTOLOGY - PAP
Adequacy: ABSENT
Comment: NEGATIVE
Diagnosis: NEGATIVE
High risk HPV: NEGATIVE

## 2022-03-13 ENCOUNTER — Other Ambulatory Visit: Payer: Self-pay | Admitting: Obstetrics & Gynecology

## 2022-03-13 DIAGNOSIS — B9689 Other specified bacterial agents as the cause of diseases classified elsewhere: Secondary | ICD-10-CM

## 2022-03-13 MED ORDER — METRONIDAZOLE 500 MG PO TABS: 500.0000 mg | ORAL_TABLET | Freq: Two times a day (BID) | ORAL | 0 refills | Status: AC

## 2022-03-13 NOTE — Progress Notes (Signed)
Rx for BV treatment 

## 2022-03-21 ENCOUNTER — Telehealth (INDEPENDENT_AMBULATORY_CARE_PROVIDER_SITE_OTHER): Payer: Self-pay

## 2022-03-21 ENCOUNTER — Other Ambulatory Visit (INDEPENDENT_AMBULATORY_CARE_PROVIDER_SITE_OTHER): Payer: Self-pay

## 2022-03-21 ENCOUNTER — Encounter (INDEPENDENT_AMBULATORY_CARE_PROVIDER_SITE_OTHER): Payer: Self-pay

## 2022-03-21 DIAGNOSIS — Z01812 Encounter for preprocedural laboratory examination: Secondary | ICD-10-CM

## 2022-03-21 MED ORDER — PEG 3350-KCL-NA BICARB-NACL 420 G PO SOLR: 4000.0000 mL | ORAL | 0 refills | Status: DC

## 2022-03-21 NOTE — Telephone Encounter (Signed)
Kailash Hinze Ann Brighid Koch, CMA  ?

## 2022-03-21 NOTE — Telephone Encounter (Signed)
Ok to schedule.  Thanks,  Maliha Outten Castaneda Mayorga, MD Gastroenterology and Hepatology [facility]  

## 2022-03-21 NOTE — Telephone Encounter (Signed)
Referring MD/PCP: Posey Pronto  Procedure: Tcs  Reason/Indication:  Screening, fam hx of colon ca  Has patient had this procedure before?  No   If so, when, by whom and where?    Is there a family history of colon cancer?  yes  Who?  What age when diagnosed?  Mother  Is patient diabetic? If yes, Type 1 or Type 2   no      Does patient have prosthetic heart valve or mechanical valve?  no  Do you have a pacemaker/defibrillator?  no  Has patient ever had endocarditis/atrial fibrillation? no  Does patient use oxygen? no  Has patient had joint replacement within last 12 months?  no  Is patient constipated or do they take laxatives? no  Does patient have a history of alcohol/drug use?  no  Have you had a stroke/heart attack last 6 mths? no  Do you take medicine for weight loss?  no  For female patients,: have you had a hysterectomy no                      are you post menopausal no                      do you still have your menstrual cycle yes  Is patient on blood thinner such as Coumadin, Plavix and/or Aspirin? No   Medications: Atorvastatin 40 mg daily, sertraline 50 mg daily  Allergies: nkda  Medication Adjustment per Dr Jenetta Downer none  Procedure date & time: 04/20/22 at 815

## 2022-04-18 ENCOUNTER — Ambulatory Visit (INDEPENDENT_AMBULATORY_CARE_PROVIDER_SITE_OTHER): Payer: PRIVATE HEALTH INSURANCE | Admitting: Orthopedic Surgery

## 2022-04-18 DIAGNOSIS — M7711 Lateral epicondylitis, right elbow: Secondary | ICD-10-CM | POA: Diagnosis not present

## 2022-04-18 DIAGNOSIS — R202 Paresthesia of skin: Secondary | ICD-10-CM

## 2022-04-18 DIAGNOSIS — R200 Anesthesia of skin: Secondary | ICD-10-CM

## 2022-04-18 NOTE — Progress Notes (Signed)
Office Visit Note   Patient: Kelly Buchanan           Date of Birth: 1971-06-17           MRN: [id number] Visit Date: 04/18/2022              Requested by: Lindell Spar, MD [address] [place],  [place] [address] PCP: Lindell Spar, MD   Assessment & Plan: Visit Diagnoses:  1. Lateral epicondylitis, right elbow   2. Numbness and tingling in left hand     Plan: Discussed with patient that she seems to have 2 different issues going on.  The right side, her symptoms and exam findings seem most consistent with lateral epicondylitis.  We discussed the nature of this condition as well as his diagnosis, prognosis, and both conservative and surgical treatment options.  She is in no treatment for this so far.  We will start with an oral anti-inflammatory medication and I will refer her to therapy.  On the left, she does seem to have symptoms and exam findings consistent with carpal tunnel syndrome.  We discussed the nature of carpal tunnel syndrome as well as its diagnosis, prognosis, and both conservative and surgical treatment options.  She does not have any evidence of cubital tunnel syndrome on the side.  She does have some mild provocative signs.  I would like to get an EMG/nerve conduction study to further evaluate her symptoms.  I can see her back in the office once this is completed.  Follow-Up Instructions: No follow-ups on file.   Orders:  No orders of the defined types were placed in this encounter.  No orders of the defined types were placed in this encounter.     Procedures: No procedures performed   Clinical Data: No additional findings.   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  On the right side, she has pain at the lateral aspect of the elbow.  Is been going on for several months now.  She denies any injury to the elbow.  The pain feels like it is "in the joint,".  She notes that her pain is constant but seems to be worse in the morning.  Her elbow is stiff when she first wakes up.  She had no treatment for this so far.  Her pain is worse with her activities such as typing or carrying objects.  On the left side, she describes numbness and tingling in the hand.  This has been going on for many months and for longer than the right elbow pain.  She describes paresthesias in the tips of all of her fingers.  This is intermittent in nature versus the constant pain in her right elbow.  She denies any nocturnal symptoms.  She notes that typing seems to make her symptoms worse.  She is no history of diabetes, hypothyroidism, cervical spine issue, wrist trauma, or inflammatory neuropathy.  She had no treatment for this so far.  She had no electrodiagnostic studies.    Review of Systems   Objective: Vital Signs: There were no vitals taken for this visit.  Physical Exam Constitutional:      Appearance: Normal appearance.  Cardiovascular:     Rate and Rhythm: Normal rate.     Pulses: Normal pulses.  Pulmonary:     Effort: Pulmonary effort is normal.  Skin:    General: Skin is warm and dry.     Capillary Refill: Capillary refill takes less than 2 seconds.  Neurological:     Mental Status: She is alert.     Left Hand Exam   Tenderness  The patient is experiencing no tenderness.   Range of Motion  The patient has normal left wrist ROM.  Other  Erythema: absent Sensation: normal Pulse: present  Comments:  Negative Tinel at wrist.  Mildly positive Durkan and Phalen signs at the wrist.  Negative Tinel at elbow w/ out ulnar nerve instability.  5/5 thenar and interosseous motor strength.    Right Elbow Exam   Tenderness  The  patient is experiencing tenderness in the lateral epicondyle.   Range of Motion  The patient has normal right elbow ROM.  Muscle Strength  The patient has normal right elbow strength.  Other  Erythema: absent Sensation: normal Pulse: present  Comments:  Reproduction of her pain w/ resisted middle finger and wrist extension with elbow extended. No pain at medial aspect of elbow.       Specialty Comments:  No specialty comments available.  Imaging: No results found.   PMFS History: Patient Active Problem List   Diagnosis Date Noted   Lateral epicondylitis, right elbow 04/18/2022   Numbness and tingling in left hand 04/18/2022   Carpal tunnel syndrome of left wrist 02/01/2022   Abnormal uterine bleeding 02/01/2022   Chronic gastritis without bleeding 02/01/2022   Left sided sciatica 12/01/2020   Anxiety 11/03/2020   Annual visit for general adult medical examination with abnormal findings 06/02/2020   Depression, major, recurrent ([facility]) 06/02/2020   Chronic bilateral low back pain with right-sided sciatica 12/03/2019   Essential hypertension 12/03/2019   Primary osteoarthritis of left knee 08/27/2019   Overweight (BMI 25.0-29.9) 07/24/2019   Vitamin D deficiency 09/07/2016   Heart murmur 09/06/2016   Hyperlipidemia 09/06/2016   Past Medical History:  Diagnosis Date   Chest heaviness 12/31/2019   Hyperhidrosis of axilla 09/06/2016   Hyperlipidemia    Murmur     Family History  Problem Relation Age of Onset   Heart disease Father 68       died at 64   Heart attack Father    Hypertension Father    Alcohol abuse Father    Hypertension Mother    Cancer Mother 62       rectal CA    Cancer Sister        hodgkins   Hypertension Sister    Obesity Sister    Cancer Maternal Aunt    Seizures Daughter     Past Surgical History:  Procedure Laterality Date   APPENDECTOMY  1980's   TONSILLECTOMY  1999   TUBAL LIGATION  1997   Social History   Occupational  History   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[employer]  Tobacco Use   Smoking status: Never   Smokeless tobacco: Never  Vaping Use   Vaping Use: Never used  Substance and Sexual Activity   Alcohol use: No    Alcohol/week: 0.0 standard drinks of alcohol   Drug use: No   Sexual activity: Yes    Birth control/protection: Surgical    Comment: tubal

## 2022-04-19 ENCOUNTER — Telehealth: Payer: Self-pay | Admitting: Orthopedic Surgery

## 2022-04-19 ENCOUNTER — Other Ambulatory Visit (HOSPITAL_COMMUNITY)
Admission: RE | Admit: 2022-04-19 | Discharge: 2022-04-19 | Disposition: A | Discharge status: Home / Self Care | Payer: PRIVATE HEALTH INSURANCE | Source: Ambulatory Visit | Attending: Gastroenterology | Admitting: Gastroenterology

## 2022-04-19 ENCOUNTER — Other Ambulatory Visit: Payer: Self-pay | Admitting: Radiology

## 2022-04-19 DIAGNOSIS — Z01812 Encounter for preprocedural laboratory examination: Secondary | ICD-10-CM | POA: Insufficient documentation

## 2022-04-19 LAB — PREGNANCY, URINE: Preg Test, Ur: NEGATIVE

## 2022-04-19 NOTE — Telephone Encounter (Signed)
I called and advised that we changed the order to go to OT at [facility]. She agreed to this change

## 2022-04-19 NOTE — Telephone Encounter (Signed)
Pt asking for her physical therapy referral be sent to [facility] in [place]. Closer to her home. Pt phone number is [phone].

## 2022-04-20 ENCOUNTER — Ambulatory Visit (HOSPITAL_COMMUNITY)
Admission: RE | Admit: 2022-04-20 | Discharge: 2022-04-20 | Disposition: A | Discharge status: Home / Self Care | Payer: PRIVATE HEALTH INSURANCE | Attending: Gastroenterology | Admitting: Gastroenterology

## 2022-04-20 ENCOUNTER — Ambulatory Visit (HOSPITAL_COMMUNITY): Payer: PRIVATE HEALTH INSURANCE | Admitting: Anesthesiology

## 2022-04-20 ENCOUNTER — Encounter (HOSPITAL_COMMUNITY): Payer: Self-pay | Admitting: Gastroenterology

## 2022-04-20 ENCOUNTER — Encounter (INDEPENDENT_AMBULATORY_CARE_PROVIDER_SITE_OTHER): Payer: Self-pay | Admitting: *Deleted

## 2022-04-20 ENCOUNTER — Other Ambulatory Visit: Payer: Self-pay

## 2022-04-20 ENCOUNTER — Encounter (HOSPITAL_COMMUNITY)
Admission: RE | Disposition: A | Discharge status: Home / Self Care | Payer: Self-pay | Source: Home / Self Care | Attending: Gastroenterology

## 2022-04-20 ENCOUNTER — Ambulatory Visit (HOSPITAL_BASED_OUTPATIENT_CLINIC_OR_DEPARTMENT_OTHER): Payer: PRIVATE HEALTH INSURANCE | Admitting: Anesthesiology

## 2022-04-20 DIAGNOSIS — F32A Depression, unspecified: Secondary | ICD-10-CM | POA: Insufficient documentation

## 2022-04-20 DIAGNOSIS — Z1211 Encounter for screening for malignant neoplasm of colon: Secondary | ICD-10-CM | POA: Diagnosis not present

## 2022-04-20 DIAGNOSIS — G709 Myoneural disorder, unspecified: Secondary | ICD-10-CM | POA: Diagnosis not present

## 2022-04-20 DIAGNOSIS — K649 Unspecified hemorrhoids: Secondary | ICD-10-CM | POA: Insufficient documentation

## 2022-04-20 DIAGNOSIS — Z800 Family history of malignant neoplasm of digestive organs: Secondary | ICD-10-CM | POA: Insufficient documentation

## 2022-04-20 DIAGNOSIS — K5730 Diverticulosis of large intestine without perforation or abscess without bleeding: Secondary | ICD-10-CM

## 2022-04-20 DIAGNOSIS — K644 Residual hemorrhoidal skin tags: Secondary | ICD-10-CM

## 2022-04-20 DIAGNOSIS — Z79899 Other long term (current) drug therapy: Secondary | ICD-10-CM | POA: Diagnosis not present

## 2022-04-20 DIAGNOSIS — M1990 Unspecified osteoarthritis, unspecified site: Secondary | ICD-10-CM | POA: Insufficient documentation

## 2022-04-20 DIAGNOSIS — I10 Essential (primary) hypertension: Secondary | ICD-10-CM | POA: Diagnosis not present

## 2022-04-20 DIAGNOSIS — F419 Anxiety disorder, unspecified: Secondary | ICD-10-CM | POA: Diagnosis not present

## 2022-04-20 HISTORY — PX: COLONOSCOPY WITH PROPOFOL: SHX5780

## 2022-04-20 LAB — HM COLONOSCOPY

## 2022-04-20 SURGERY — COLONOSCOPY WITH PROPOFOL
Anesthesia: General

## 2022-04-20 MED ORDER — PROPOFOL 10 MG/ML IV BOLUS
INTRAVENOUS | Status: DC | PRN
  Administered 2022-04-20: 100 mg via INTRAVENOUS
  Administered 2022-04-20 (×2): 30 mg via INTRAVENOUS

## 2022-04-20 MED ORDER — PROPOFOL 500 MG/50ML IV EMUL
INTRAVENOUS | Status: DC | PRN
  Administered 2022-04-20: 150 ug/kg/min via INTRAVENOUS

## 2022-04-20 MED ORDER — LIDOCAINE HCL (CARDIAC) PF 100 MG/5ML IV SOSY
PREFILLED_SYRINGE | INTRAVENOUS | Status: DC | PRN
  Administered 2022-04-20: 50 mg via INTRAVENOUS

## 2022-04-20 MED ORDER — LACTATED RINGERS IV SOLN: INTRAVENOUS | Status: DC

## 2022-04-20 NOTE — Op Note (Signed)
[facility] Patient Name: Kelly Buchanan Procedure Date: 04/20/2022 8:06 AM MRN: [id number] Date of Birth: Oct 15, 1970 Attending MD: Maylon Peppers ,  CSN: [id number] Age: 51 Admit Type: Outpatient Procedure:                Colonoscopy Indications:              Screening for colorectal malignant neoplasm Providers:                Maylon Peppers, Caprice Kluver, Raphael Gibney,                            Technician Referring MD:             Maylon Peppers Medicines:                Monitored Anesthesia Care Complications:            No immediate complications. Estimated Blood Loss:     Estimated blood loss: none. Procedure:                Pre-Anesthesia Assessment:                           - Prior to the procedure, a History and Physical                            was performed, and patient medications, allergies                            and sensitivities were reviewed. The patient's                            tolerance of previous anesthesia was reviewed.                           - The risks and benefits of the procedure and the                            sedation options and risks were discussed with the                            patient. All questions were answered and informed                            consent was obtained.                           - ASA Grade Assessment: I - A normal, healthy                            patient.                           After obtaining informed consent, the colonoscope                            was passed under direct vision. Throughout the  procedure, the patient's blood pressure, pulse, and                            oxygen saturations were monitored continuously. The                            [id number]-HQ190L ([id number]) scope was introduced through                            the anus and advanced to the the cecum, identified                            by appendiceal orifice and ileocecal valve. The                             colonoscopy was performed without difficulty. The                            patient tolerated the procedure well. The quality                            of the bowel preparation was good. Scope In: 8:14:29 AM Scope Out: 8:34:24 AM Scope Withdrawal Time: 0 hours 12 minutes 39 seconds  Total Procedure Duration: 0 hours 19 minutes 55 seconds  Findings:      Small external hemorrhoids were found on perianal exam.      A few small-mouthed diverticula were found in the sigmoid colon.      The retroflexed view of the distal rectum and anal verge was normal and       showed no anal or rectal abnormalities. Impression:               - Hemorrhoids found on perianal exam.                           - Diverticulosis in the sigmoid colon.                           - The distal rectum and anal verge are normal on                            retroflexion view.                           - No specimens collected. Moderate Sedation:      Per Anesthesia Care Recommendation:           - Discharge patient to home (ambulatory).                           - Resume previous diet.                           - Repeat colonoscopy in 10 years for screening                            purposes. Procedure Code(s):        ---  Professional ---                           Z6109, Colorectal cancer screening; colonoscopy on                            individual not meeting criteria for high risk Diagnosis Code(s):        --- Professional ---                           Z12.11, Encounter for screening for malignant                            neoplasm of colon                           K64.9, Unspecified hemorrhoids                           K57.30, Diverticulosis of large intestine without                            perforation or abscess without bleeding CPT copyright 2019 American Medical Association. All rights reserved. The codes documented in this report are preliminary and upon coder review may  be revised  to meet current compliance requirements. Maylon Peppers, MD Maylon Peppers,  04/20/2022 8:37:40 AM This report has been signed electronically. Number of Addenda: 0

## 2022-04-20 NOTE — Transfer of Care (Signed)
Immediate Anesthesia Transfer of Care Note  Patient: Kelly Buchanan  Procedure(s) Performed: COLONOSCOPY WITH PROPOFOL  Patient Location: Endoscopy Unit  Anesthesia Type:General  Level of Consciousness: awake  Airway & Oxygen Therapy: Patient Spontanous Breathing  Post-op Assessment: Report given to RN and Post -op Vital signs reviewed and stable  Post vital signs: Reviewed and stable  Last Vitals:  Vitals Value Taken Time  BP    Temp    Pulse    Resp    SpO2      Last Pain:  Vitals:   04/20/22 0811  TempSrc:   PainSc: 0-No pain      Patients Stated Pain Goal: 8 (25/00/37 0488)  Complications: No notable events documented.

## 2022-04-20 NOTE — Anesthesia Postprocedure Evaluation (Signed)
Anesthesia Post Note  Patient: [name]  Procedure(s) Performed: COLONOSCOPY WITH PROPOFOL  Patient location during evaluation: Phase II Anesthesia Type: General Level of consciousness: awake and alert and oriented Pain management: pain level controlled Vital Signs Assessment: post-procedure vital signs reviewed and stable Respiratory status: spontaneous breathing, nonlabored ventilation and respiratory function stable Cardiovascular status: blood pressure returned to baseline and stable Postop Assessment: no apparent nausea or vomiting Anesthetic complications: no   No notable events documented.   Last Vitals:  Vitals:   04/20/22 0836 04/20/22 0839  BP: (!) 78/56 101/61  Pulse: 77   Resp: 19   Temp: 36.4 C   SpO2: 99%     Last Pain:  Vitals:   04/20/22 0836  TempSrc: Oral  PainSc: 0-No pain                 Toan Mort C Keyshon Stein

## 2022-04-20 NOTE — Anesthesia Procedure Notes (Addendum)
Date/Time: 04/20/2022 8:20 AM  Performed by: Karna Dupes, CRNAPre-anesthesia Checklist: Patient identified, Emergency Drugs available, Suction available and Patient being monitored Oxygen Delivery Method: Nasal cannula

## 2022-04-20 NOTE — Anesthesia Preprocedure Evaluation (Addendum)
Anesthesia Evaluation  Patient identified by MRN, date of birth, ID band Patient awake    Reviewed: Allergy & Precautions, NPO status , Patient's Chart, lab work & pertinent test results  Airway Mallampati: II  TM Distance: >3 FB Neck ROM: Full    Dental  (+) Missing, Dental Advisory Given   Pulmonary neg pulmonary ROS,    Pulmonary exam normal breath sounds clear to auscultation       Cardiovascular hypertension, Pt. on medications Normal cardiovascular exam+ Valvular Problems/Murmurs  Rhythm:Regular Rate:Normal     Neuro/Psych PSYCHIATRIC DISORDERS Anxiety Depression  Neuromuscular disease (left sciatica, left hand numbness)    GI/Hepatic negative GI ROS, Neg liver ROS,   Endo/Other  negative endocrine ROS  Renal/GU negative Renal ROS  negative genitourinary   Musculoskeletal  (+) Arthritis , Osteoarthritis,    Abdominal   Peds negative pediatric ROS (+)  Hematology negative hematology ROS (+)   Anesthesia Other Findings   Reproductive/Obstetrics negative OB ROS                           Anesthesia Physical Anesthesia Plan  ASA: 2  Anesthesia Plan: General   Post-op Pain Management: Minimal or no pain anticipated   Induction: Intravenous  PONV Risk Score and Plan: Propofol infusion  Airway Management Planned: Nasal Cannula and Natural Airway  Additional Equipment:   Intra-op Plan:   Post-operative Plan:   Informed Consent: I have reviewed the patients History and Physical, chart, labs and discussed the procedure including the risks, benefits and alternatives for the proposed anesthesia with the patient or authorized representative who has indicated his/her understanding and acceptance.     Dental advisory given  Plan Discussed with: CRNA and Surgeon  Anesthesia Plan Comments:         Anesthesia Quick Evaluation

## 2022-04-20 NOTE — H&P (Signed)
Kelly Buchanan is an 51 y.o. female.   Chief Complaint: Screening colonoscopy HPI: 51 year old female with past medical history of hyperlipidemia, coming for screening colonoscopy. The patient has never had a colonoscopy in the past.  The patient denies having any complaints such as melena, hematochezia, abdominal pain or distention, change in her bowel movement consistency or frequency, no changes in her weight recently.  Mother was diagnosed with rectal cancer in her 5s.  Past Medical History:  Diagnosis Date   Chest heaviness 12/31/2019   Hyperhidrosis of axilla 09/06/2016   Hyperlipidemia    Murmur     Past Surgical History:  Procedure Laterality Date   APPENDECTOMY  28's   TONSILLECTOMY  1999   TUBAL LIGATION  1997    Family History  Problem Relation Age of Onset   Heart disease Father 78       died at 2   Heart attack Father    Hypertension Father    Alcohol abuse Father    Hypertension Mother    Cancer Mother 45       rectal CA    Cancer Sister        hodgkins   Hypertension Sister    Obesity Sister    Cancer Maternal Aunt    Seizures Daughter    Social History:  reports that she has never smoked. She has never used smokeless tobacco. She reports that she does not drink alcohol and does not use drugs.  Allergies: No Known Allergies  Medications Prior to Admission  Medication Sig Dispense Refill   atorvastatin (LIPITOR) 40 MG tablet Take 1 tablet (40 mg total) by mouth daily. 90 tablet 3   ibuprofen (ADVIL) 200 MG tablet Take 600 mg by mouth every 8 (eight) hours as needed for moderate pain or headache.     polyethylene glycol-electrolytes (TRILYTE) 420 g solution Take 4,000 mLs by mouth as directed. 4000 mL 0   sertraline (ZOLOFT) 50 MG tablet Take 1 tablet (50 mg total) by mouth daily. 90 tablet 1   fluticasone (FLONASE) 50 MCG/ACT nasal spray Place 2 sprays into both nostrils daily. (Patient not taking: Reported on 04/14/2022) 16 g 2    Results for orders  placed or performed during the hospital encounter of 04/19/22 (from the past 48 hour(s))  Pregnancy, urine     Status: None   Collection Time: 04/19/22  1:35 PM  Result Value Ref Range   Preg Test, Ur NEGATIVE NEGATIVE    Comment:        THE SENSITIVITY OF THIS METHODOLOGY IS >20 mIU/mL. Performed at [facility], [address], [place], [place] [address]    No results found.  Review of Systems  All other systems reviewed and are negative.   Blood pressure 132/88, pulse 99, temperature 98.7 F (37.1 C), temperature source Oral, resp. rate 13, height '5\' 7"'$  (1.702 m), weight 87.1 kg, SpO2 99 %. Physical Exam  GENERAL: The patient is AO x3, in no acute distress. HEENT: Head is normocephalic and atraumatic. EOMI are intact. Mouth is well hydrated and without lesions. NECK: Supple. No masses LUNGS: Clear to auscultation. No presence of rhonchi/wheezing/rales. Adequate chest expansion HEART: RRR, normal s1 and s2. ABDOMEN: Soft, nontender, no guarding, no peritoneal signs, and nondistended. BS +. No masses. EXTREMITIES: Without any cyanosis, clubbing, rash, lesions or edema. NEUROLOGIC: AOx3, no focal motor deficit. SKIN: no jaundice, no rashes  Assessment/Plan 51 year old female with past medical history of hyperlipidemia, coming for screening colonoscopy.  We  will proceed with colonoscopy. Harvel Quale, MD 04/20/2022, 7:40 AM

## 2022-04-20 NOTE — Discharge Instructions (Signed)
You are being discharged to home.  Resume your previous diet.  Your physician has recommended a repeat colonoscopy in 10 years for screening purposes.  

## 2022-04-21 ENCOUNTER — Other Ambulatory Visit: Payer: PRIVATE HEALTH INSURANCE

## 2022-04-26 ENCOUNTER — Encounter (HOSPITAL_COMMUNITY): Payer: Self-pay | Admitting: Gastroenterology

## 2022-04-27 ENCOUNTER — Encounter: Payer: Self-pay | Admitting: Internal Medicine

## 2022-04-28 ENCOUNTER — Encounter: Payer: Self-pay | Admitting: Nurse Practitioner

## 2022-04-28 ENCOUNTER — Ambulatory Visit (INDEPENDENT_AMBULATORY_CARE_PROVIDER_SITE_OTHER): Payer: Self-pay | Admitting: Nurse Practitioner

## 2022-04-28 DIAGNOSIS — I10 Essential (primary) hypertension: Secondary | ICD-10-CM

## 2022-04-30 NOTE — Patient Instructions (Signed)
Appointment cancelled by the patient

## 2022-04-30 NOTE — Progress Notes (Signed)
Appointment cancelled by the patient

## 2022-05-02 ENCOUNTER — Other Ambulatory Visit: Payer: Self-pay | Admitting: Obstetrics & Gynecology

## 2022-05-02 DIAGNOSIS — N852 Hypertrophy of uterus: Secondary | ICD-10-CM

## 2022-05-03 ENCOUNTER — Ambulatory Visit (INDEPENDENT_AMBULATORY_CARE_PROVIDER_SITE_OTHER): Payer: PRIVATE HEALTH INSURANCE

## 2022-05-03 DIAGNOSIS — N852 Hypertrophy of uterus: Secondary | ICD-10-CM

## 2022-05-03 NOTE — Progress Notes (Signed)
PELVIC US TA/TV: enlarged heterogeneous anteverted uterus with multiple fibroid,(#1) subserosal right 6 x 5.5 x 5.4 cm,(#2) subserosal left 7.2 x 6.5 x 5.4 cm,EEC 19 mm,simple unilocular right ovarian cyst 2.6 x 1.6 x 2 cm,normal left ovary,no free fluid,no pain during ultrasound

## 2022-05-09 ENCOUNTER — Telehealth: Payer: Self-pay | Admitting: Obstetrics & Gynecology

## 2022-05-09 NOTE — Telephone Encounter (Signed)
Left message to call back  Want to review results of [place]- enlarged uterus with fibroids. Pt previously had reports [facility]MB and want to discuss management options

## 2022-05-10 ENCOUNTER — Ambulatory Visit (HOSPITAL_COMMUNITY): Payer: PRIVATE HEALTH INSURANCE | Admitting: Occupational Therapy

## 2022-05-23 ENCOUNTER — Encounter (HOSPITAL_COMMUNITY): Payer: Self-pay | Admitting: Occupational Therapy

## 2022-05-23 ENCOUNTER — Ambulatory Visit (HOSPITAL_COMMUNITY): Payer: PRIVATE HEALTH INSURANCE | Attending: Orthopedic Surgery | Admitting: Occupational Therapy

## 2022-05-23 DIAGNOSIS — R29898 Other symptoms and signs involving the musculoskeletal system: Secondary | ICD-10-CM | POA: Insufficient documentation

## 2022-05-23 DIAGNOSIS — M25521 Pain in right elbow: Secondary | ICD-10-CM | POA: Insufficient documentation

## 2022-05-23 NOTE — Therapy (Signed)
OUTPATIENT OCCUPATIONAL THERAPY ORTHO EVALUATION  Patient Name: BRANDYCE DIMARIO MRN: [id number] DOB:10/23/1970, 51 y.o., female Today's Date: 05/23/2022  PCP: Dr. Ihor Dow REFERRING PROVIDER: Dr. Sherilyn Cooter   OT End of Session - 05/23/22 1156     Visit Number 1    Number of Visits 1    Date for OT Re-Evaluation 05/24/22    Authorization Type [employer]    Authorization Time Period 60 visit limit, 0 used on eval    Authorization - Visit Number 1    Authorization - Number of Visits 16    OT Start Time 1115    OT Stop Time 1138    OT Time Calculation (min) 23 min    Activity Tolerance Patient tolerated treatment well    Behavior During Therapy [facility] for tasks assessed/performed             Past Medical History:  Diagnosis Date   Chest heaviness 12/31/2019   Hyperhidrosis of axilla 09/06/2016   Hyperlipidemia    Murmur    Past Surgical History:  Procedure Laterality Date   APPENDECTOMY  1980's   COLONOSCOPY WITH PROPOFOL N/A 04/20/2022   Procedure: COLONOSCOPY WITH PROPOFOL;  Surgeon: Harvel Quale, MD;  [facility]: [facility];  Service: Gastroenterology;  Laterality: N/A;  [place]   Patient Active Problem List   Diagnosis Date Noted   Lateral epicondylitis, right elbow 04/18/2022   Numbness and tingling in left hand 04/18/2022   Carpal tunnel syndrome of left wrist 02/01/2022   Abnormal uterine bleeding 02/01/2022   Chronic gastritis without bleeding 02/01/2022   Left sided sciatica 12/01/2020   Anxiety 11/03/2020   Annual visit for general adult medical examination with abnormal findings 06/02/2020   Depression, major, recurrent ([place]) 06/02/2020   Chronic bilateral low back pain with right-sided sciatica 12/03/2019   Essential hypertension 12/03/2019   Primary osteoarthritis of left knee 08/27/2019   Overweight (BMI 25.0-29.9) 07/24/2019   Vitamin D deficiency 09/07/2016   Heart murmur 09/06/2016    Hyperlipidemia 09/06/2016    ONSET DATE: several months ago  REFERRING DIAG: right lateral epicondylitis  THERAPY DIAG:  Pain in right elbow  Other symptoms and signs involving the musculoskeletal system  Rationale for Evaluation and Treatment Rehabilitation  SUBJECTIVE:   SUBJECTIVE STATEMENT: S: It's gotten better over the past couple of weeks Pt accompanied by: self  PERTINENT HISTORY: Pt is a 51 y/o female presenting with right lateral epicondylitis. Pt reports improvement over the past couple of weeks since she has been on vacation and in training and doing less typing.   PRECAUTIONS: None  WEIGHT BEARING RESTRICTIONS No  PAIN:  Are you having pain? No  FALLS: Has patient fallen in last 6 months? No   PLOF: Independent  PATIENT GOALS To have less pain in her right arm.  OBJECTIVE:   HAND DOMINANCE: Right  ADLs: Overall ADLs: Pt has noticed mildly decreased grip strength, notes it is easier to open jars and things with her left hand versus her right hand now. Reports increased pain with typing for longer lengths of time.   UPPER EXTREMITY ROM     Active ROM Right eval  Elbow flexion 140  Elbow extension 0  Wrist flexion 68  Wrist extension 65  Wrist ulnar deviation 40  Wrist radial deviation 22  Wrist pronation 90  Wrist supination 90  (Blank rows = not tested)    UPPER EXTREMITY MMT:  MMT Right eval  Elbow flexion 5/5  Elbow extension 5/5  Wrist flexion 5/5  Wrist extension 5/5  Wrist ulnar deviation 5/5  Wrist radial deviation 5/5  Wrist pronation 5/5  Wrist supination 5/5  (Blank rows = not tested)  HAND FUNCTION: Grip strength: Right: 65 lbs; Left: 68 lbs, Lateral pinch: Right: 14 lbs, Left: 16 lbs, and 3 point pinch: Right: 14 lbs, Left: 13 lbs    COGNITION: Overall cognitive status: Within functional limits for tasks assessed   PATIENT EDUCATION: Education details: Home plan for lateral epicondylitis  Person educated:  Patient Education method: Explanation, Demonstration, and Handouts Education comprehension: verbalized understanding and returned demonstration   HOME EXERCISE PROGRAM: Eval: Home epicondylitis program  ASSESSMENT:  CLINICAL IMPRESSION: Patient is a 51 y.o. female who was seen today for occupational therapy evaluation for right lateral epicondylitis. Pt reports less pain over the past 2 weeks with less typing. OT palpating moderate fascial restriction along right forearm extensors, educated pt on self-myofascial release and friction massage to address. Pt demonstrating good ROM and strength, both WNL for forearm, wrist, and grip/pinch. Discussed home program with pt, provided lateral epicondylitis program including forearm stretches, wrist strengthening in full RUE extension position focusing on controlled movement and not rushing through each task, and grip strengthening. Pt returning demonstration, no additional questions.   PERFORMANCE DEFICITS in functional skills including ADLs, IADLs, pain, fascial restrictions, and UE functional use  IMPAIRMENTS are limiting patient from ADLs and IADLs.   COMORBIDITIES has no other co-morbidities that affects occupational performance. Patient will benefit from skilled OT to address above impairments and improve overall function.  MODIFICATION OR ASSISTANCE TO COMPLETE EVALUATION: No modification of tasks or assist necessary to complete an evaluation.  OT OCCUPATIONAL PROFILE AND HISTORY: Problem focused assessment: Including review of records relating to presenting problem.  CLINICAL DECISION MAKING: LOW - limited treatment options, no task modification necessary  REHAB POTENTIAL: Good  EVALUATION COMPLEXITY: Low      PLAN: OT FREQUENCY: one time visit  OT DURATION: other: 1x visit  PLANNED INTERVENTIONS: therapeutic exercise, manual therapy, and patient/family education   CONSULTED AND AGREED WITH PLAN OF CARE: Patient  PLAN FOR  NEXT SESSION: N/A, eval only with HEP   Guadelupe Sabin, OTR/L  [phone] 05/23/2022, 11:57 AM

## 2022-05-25 ENCOUNTER — Encounter: Payer: Self-pay | Admitting: Obstetrics & Gynecology

## 2022-05-25 ENCOUNTER — Ambulatory Visit (INDEPENDENT_AMBULATORY_CARE_PROVIDER_SITE_OTHER): Payer: PRIVATE HEALTH INSURANCE | Admitting: Obstetrics & Gynecology

## 2022-05-25 VITALS — BP 140/89 | HR 71 | Ht 67.0 in | Wt 196.0 lb

## 2022-05-25 DIAGNOSIS — N920 Excessive and frequent menstruation with regular cycle: Secondary | ICD-10-CM | POA: Diagnosis not present

## 2022-05-25 DIAGNOSIS — D252 Subserosal leiomyoma of uterus: Secondary | ICD-10-CM

## 2022-05-25 NOTE — Progress Notes (Signed)
   GYN VISIT Patient name: Kelly Buchanan MRN [id number]  Date of birth: 1971/09/22 Chief Complaint:   Fibroids  History of Present Illness:   Kelly Buchanan is a 51 y.o. [phone] female being seen today for HMB follow up.   In review- Menses last about 6 days with moderate to heavy bleeding- super tampon every 2 hours. Some pelvic pressure.  She reports no chagne in her menses since last visit.  Denies intermnestrual bleeding.  [place] completed-  05/2022- 9 x 11.2 x 10.6 cm, Total uterine volume 559.3 cc,enlarged heterogeneous anteverted uterus with multiple fibroid,(#1) subserosal right 6 x 5.5 x 5.4 cm,(#2) subserosal left 7.2 x 6.5 x 5.4 cm  Patient's last menstrual period was 05/22/2022 (approximate).     03/08/2022    2:29 PM 02/21/2022    2:38 PM 02/01/2022   10:11 AM 09/29/2021    3:27 PM 09/20/2021    1:03 PM  Depression screen PHQ 2/9  Decreased Interest 0 0 0 0 0  Down, Depressed, Hopeless 0 0 0 0 0  PHQ - 2 Score 0 0 0 0 0  Altered sleeping 0 0 0    Tired, decreased energy 1 0 0    Change in appetite 0 0 0    Feeling bad or failure about yourself  0 0 0    Trouble concentrating 0 0 0    Moving slowly or fidgety/restless 0 0 0    Suicidal thoughts 0 0 0    PHQ-9 Score 1 0 0    Difficult doing work/chores  Not difficult at all Not difficult at all       Review of Systems:   Pertinent items are noted in HPI Denies fever/chills, dizziness, headaches, visual disturbances, fatigue, shortness of breath, chest pain, abdominal pain, vomiting, no problems with bowel movements, urination, or intercourse unless otherwise stated above.  Pertinent History Reviewed:  Reviewed past medical,surgical, social, obstetrical and family history.  Reviewed problem list, medications and allergies. Physical Assessment:   Vitals:   05/25/22 1359  BP: (!) 140/89  Pulse: 71  Weight: 196 lb (88.9 kg)  Height: '5\' 7"'$  (1.702 m)  Body mass index is 30.7 kg/m.       Physical Examination:   General  appearance: alert, well appearing, and in no distress  Psych: mood appropriate, normal affect  Skin: warm & dry   Cardiovascular: normal heart rate noted  Respiratory: normal respiratory effort, no distress  Abdomen: soft, non-tender, uterus well below umbilicus  Pelvic: examination not indicated  Extremities: no edema   Chaperone: N/A    Assessment & Plan:  1) HMB, Uterine fibroids -plan for baseline lab work today- CBC previously ordered to r/o anemia -discussed all options including POPs, TXA, [place] to surgical intervention such as hysterectomy.  Reviewed recovery and hospitalization- would desire overnight stay -Questions and concerns were addressed -not sure what she would want at this time.  Pt given handout regarding all options '[]'$  pt to call with plan/next step  Return if symptoms worsen or fail to improve, for annual in 1 yr- June.   Janyth Pupa, DO Attending [place], [employer] for [employer], [place]

## 2022-05-26 ENCOUNTER — Other Ambulatory Visit: Payer: Self-pay | Admitting: Internal Medicine

## 2022-05-26 DIAGNOSIS — E559 Vitamin D deficiency, unspecified: Secondary | ICD-10-CM

## 2022-05-26 LAB — LIPID PANEL
Chol/HDL Ratio: 5.2 ratio — ABNORMAL HIGH (ref 0.0–4.4)
Cholesterol, Total: 234 mg/dL — ABNORMAL HIGH (ref 100–199)
HDL: 45 mg/dL (ref >39)
LDL Chol Calc (NIH): 135 mg/dL — ABNORMAL HIGH (ref 0–99)
Triglycerides: 298 mg/dL — ABNORMAL HIGH (ref 0–149)
VLDL Cholesterol Cal: 54 mg/dL — ABNORMAL HIGH (ref 5–40)

## 2022-05-26 LAB — CMP14+EGFR
ALT: 15 IU/L (ref 0–32)
AST: 15 IU/L (ref 0–40)
Albumin/Globulin Ratio: 1.9 (ref 1.2–2.2)
Albumin: 4.7 g/dL (ref 3.9–4.9)
Alkaline Phosphatase: 183 IU/L — ABNORMAL HIGH (ref 44–121)
BUN/Creatinine Ratio: 11 (ref 9–23)
BUN: 8 mg/dL (ref 6–24)
Bilirubin Total: 0.2 mg/dL (ref 0.0–1.2)
CO2: 24 mmol/L (ref 20–29)
Calcium: 9.3 mg/dL (ref 8.7–10.2)
Chloride: 100 mmol/L (ref 96–106)
Creatinine, Ser: 0.75 mg/dL (ref 0.57–1.00)
Globulin, Total: 2.5 g/dL (ref 1.5–4.5)
Glucose: 87 mg/dL (ref 70–99)
Potassium: 4.5 mmol/L (ref 3.5–5.2)
Sodium: 140 mmol/L (ref 134–144)
Total Protein: 7.2 g/dL (ref 6.0–8.5)
eGFR: 98 mL/min/{1.73_m2} (ref >59)

## 2022-05-26 LAB — CBC WITH DIFFERENTIAL/PLATELET
Basophils Absolute: 0 10*3/uL (ref 0.0–0.2)
Basos: 0 %
EOS (ABSOLUTE): 0 10*3/uL (ref 0.0–0.4)
Eos: 1 %
Hematocrit: 39.6 % (ref 34.0–46.6)
Hemoglobin: 13.2 g/dL (ref 11.1–15.9)
Immature Grans (Abs): 0 10*3/uL (ref 0.0–0.1)
Immature Granulocytes: 0 %
Lymphocytes Absolute: 1.6 10*3/uL (ref 0.7–3.1)
Lymphs: 26 %
MCH: 29 pg (ref 26.6–33.0)
MCHC: 33.3 g/dL (ref 31.5–35.7)
MCV: 87 fL (ref 79–97)
Monocytes Absolute: 0.5 10*3/uL (ref 0.1–0.9)
Monocytes: 8 %
Neutrophils Absolute: 3.9 10*3/uL (ref 1.4–7.0)
Neutrophils: 65 %
Platelets: 365 10*3/uL (ref 150–450)
RBC: 4.55 x10E6/uL (ref 3.77–5.28)
RDW: 13.4 % (ref 11.7–15.4)
WBC: 6 10*3/uL (ref 3.4–10.8)

## 2022-05-26 LAB — VITAMIN D 25 HYDROXY (VIT D DEFICIENCY, FRACTURES): Vit D, 25-Hydroxy: 15.7 ng/mL — ABNORMAL LOW (ref 30.0–100.0)

## 2022-05-26 LAB — HEMOGLOBIN A1C
Est. average glucose Bld gHb Est-mCnc: 114 mg/dL
Hgb A1c MFr Bld: 5.6 % (ref 4.8–5.6)

## 2022-05-26 LAB — TSH: TSH: 1.28 u[IU]/mL (ref 0.450–4.500)

## 2022-05-26 MED ORDER — VITAMIN D (ERGOCALCIFEROL) 1.25 MG (50000 UNIT) PO CAPS: 50000.0000 [IU] | ORAL_CAPSULE | ORAL | 1 refills | Status: DC

## 2022-06-08 ENCOUNTER — Ambulatory Visit: Payer: PRIVATE HEALTH INSURANCE | Admitting: Internal Medicine

## 2022-06-08 ENCOUNTER — Encounter: Payer: Self-pay | Admitting: Internal Medicine

## 2022-06-08 NOTE — Telephone Encounter (Signed)
scheduled

## 2022-08-04 ENCOUNTER — Encounter: Payer: Self-pay | Admitting: Internal Medicine

## 2022-08-04 ENCOUNTER — Ambulatory Visit (INDEPENDENT_AMBULATORY_CARE_PROVIDER_SITE_OTHER): Payer: PRIVATE HEALTH INSURANCE | Admitting: Internal Medicine

## 2022-08-04 VITALS — BP 132/82 | HR 77 | Resp 18 | Ht 67.0 in | Wt 187.2 lb

## 2022-08-04 DIAGNOSIS — K2950 Unspecified chronic gastritis without bleeding: Secondary | ICD-10-CM | POA: Diagnosis not present

## 2022-08-04 DIAGNOSIS — F3341 Major depressive disorder, recurrent, in partial remission: Secondary | ICD-10-CM | POA: Diagnosis not present

## 2022-08-04 DIAGNOSIS — E782 Mixed hyperlipidemia: Secondary | ICD-10-CM | POA: Diagnosis not present

## 2022-08-04 DIAGNOSIS — Z23 Encounter for immunization: Secondary | ICD-10-CM | POA: Diagnosis not present

## 2022-08-04 DIAGNOSIS — E559 Vitamin D deficiency, unspecified: Secondary | ICD-10-CM

## 2022-08-04 DIAGNOSIS — I10 Essential (primary) hypertension: Secondary | ICD-10-CM

## 2022-08-04 DIAGNOSIS — F320 Major depressive disorder, single episode, mild: Secondary | ICD-10-CM

## 2022-08-04 MED ORDER — SERTRALINE HCL 50 MG PO TABS: 50.0000 mg | ORAL_TABLET | Freq: Every day | ORAL | 1 refills | Status: DC

## 2022-08-04 MED ORDER — OMEPRAZOLE 20 MG PO CPDR: 20.0000 mg | DELAYED_RELEASE_CAPSULE | Freq: Every day | ORAL | 3 refills | Status: DC

## 2022-08-04 NOTE — Assessment & Plan Note (Addendum)
Last vitamin D Lab Results  Component Value Date   VD25OH 15.7 (L) 05/25/2022   On vitamin D supplement

## 2022-08-04 NOTE — Assessment & Plan Note (Signed)
BP Readings from Last 1 Encounters:  08/04/22 132/82   Well-controlled with diet modification Counseled for compliance with the medications Advised DASH diet and moderate exercise/walking, at least 150 mins/week

## 2022-08-04 NOTE — Assessment & Plan Note (Signed)
Lipid profile reviewed last blood work Continue atorvastatin for now Check lipid profile

## 2022-08-04 NOTE — Assessment & Plan Note (Signed)
Well-controlled with Zoloft °

## 2022-08-04 NOTE — Patient Instructions (Signed)
Please take Omeprazole as prescribed for acid reflux.  Please continue to take other medications as prescribed.  Please get fasting blood tests done before the next visit.

## 2022-08-04 NOTE — Assessment & Plan Note (Signed)
Reports acid reflux with daily products Trial of omeprazole Advised to avoid hot and spicy food Can avoid daily products, may have lactose intolerance

## 2022-08-04 NOTE — Progress Notes (Signed)
Established Patient Office Visit  Subjective:  Patient ID: Kelly Buchanan, female    DOB: 05/25/1971  Age: 51 y.o. MRN: [id number]  CC:  Chief Complaint  Patient presents with   Follow-up    Follow up HTN and HLD     HPI Kelly Buchanan is a 50 y.o. female with past medical history of HLD, OA, depression and GAD who presents for f/u of her chronic medical conditions.  HTN: BP is well controlled today.  She has had elevated BP readings in the past, but her BP has improved with diet modification.  MDD: She takes Zoloft currently.  Denies any anhedonia, spells of anxiety, SI or HI currently.  HLD: She has started taking Lipitor regularly now.  Her last lipid profile showed elevated LDL, but she was taking Lipitor every other day at that time.  She reports mild epigastric burning after having dairy products.  She was given omeprazole in the last visit, but has not taken it yet.  She denies any nausea or vomiting currently.  Denies any dysphagia or odynophagia currently.  She has started taking vitamin D supplement and has been feeling improvement in her fatigue.  Past Medical History:  Diagnosis Date   Chest heaviness 12/31/2019   Hyperhidrosis of axilla 09/06/2016   Hyperlipidemia    Murmur     Past Surgical History:  Procedure Laterality Date   APPENDECTOMY  1980's   COLONOSCOPY WITH PROPOFOL N/A 04/20/2022   Procedure: COLONOSCOPY WITH PROPOFOL;  Surgeon: Harvel Quale, MD;  [facility];  Service: Gastroenterology;  Laterality: N/A;  [place]    Family History  Problem Relation Age of Onset   Heart disease Father 23       died at 85   Heart attack Father    Hypertension Father    Alcohol abuse Father    Hypertension Mother    Cancer Mother 31       rectal CA    Cancer Sister        hodgkins   Hypertension Sister    Obesity Sister    Cancer Maternal Aunt    Seizures Daughter     Social History    Socioeconomic History   Marital status: Single    Spouse name: Not on file   Number of children: 4   Years of education: 14   Highest education level: Some college, no degree  Occupational History   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[employer]  Tobacco Use   Smoking status: Never   Smokeless tobacco: Never  Vaping Use   Vaping Use: Never used  Substance and Sexual Activity   Alcohol use: No    Alcohol/week: 0.0 standard drinks of alcohol   Drug use: No   Sexual activity: Yes    Birth control/protection: Surgical    Comment: tubal  Other Topics Concern   Not on file  Social History Narrative   Lives with Mother   Has four children (girls)   [place] 25      Engineer, 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 snack   Caffeine: 2 cups coffee mountain dew 20 oz every other day   Water: 1/2 gallon but rare      Wears seat belt  Does not wear sunscreens   Smoke detectors at home   Does not text and drive   Social Determinants of Health   Financial Resource Strain: Low Risk  (03/08/2022)   Overall Financial Resource Strain (CARDIA)    Difficulty of Paying Living Expenses: Not hard at all  Food Insecurity: No Food Insecurity (03/08/2022)   Hunger Vital Sign    Worried About Running Out of Food in the Last Year: Never true    Ran Out of Food in the Last Year: Never true  Transportation Needs: No Transportation Needs (03/08/2022)   PRAPARE - Hydrologist (Medical): No    Lack of Transportation (Non-Medical): No  Physical Activity: Insufficiently Active (03/08/2022)   Exercise Vital Sign    Days of Exercise per Week: 2 days    Minutes of Exercise per Session: 30 min  Stress: No Stress Concern Present (03/08/2022)   [place]    Feeling of Stress : Only a little  Social  Connections: Moderately Isolated (03/08/2022)   Social Connection and Isolation Panel [NHANES]    Frequency of Communication with Friends and Family: More than three times a week    Frequency of Social Gatherings with Friends and Family: Once a week    Attends Religious Services: 1 to 4 times per year    Active Member of [employer] or Organizations: No    Attends Archivist Meetings: Never    Marital Status: Never married  Intimate Partner Violence: Not At Risk (03/08/2022)   Humiliation, Afraid, Rape, and Kick questionnaire    Fear of Current or Ex-Partner: No    Emotionally Abused: No    Physically Abused: No    Sexually Abused: No    Outpatient Medications Prior to Visit  Medication Sig Dispense Refill   atorvastatin (LIPITOR) 40 MG tablet Take 1 tablet (40 mg total) by mouth daily. 90 tablet 3   ibuprofen (ADVIL) 200 MG tablet Take 600 mg by mouth every 8 (eight) hours as needed for moderate pain or headache.     Vitamin D, Ergocalciferol, (DRISDOL) 1.25 MG (50000 UNIT) CAPS capsule Take 1 capsule (50,000 Units total) by mouth every 7 (seven) days. 12 capsule 1   sertraline (ZOLOFT) 50 MG tablet Take 1 tablet (50 mg total) by mouth daily. 90 tablet 1   No facility-administered medications prior to visit.    No Known Allergies  ROS Review of Systems  Constitutional:  Negative for chills and fever.  HENT:  Negative for congestion, sinus pressure, sinus pain and sore throat.   Eyes:  Negative for pain and discharge.  Respiratory:  Negative for cough and shortness of breath.   Cardiovascular:  Negative for chest pain and palpitations.  Gastrointestinal:  Negative for abdominal pain, diarrhea, nausea and vomiting.  Endocrine: Negative for polydipsia and polyuria.  Genitourinary:  Positive for menstrual problem. Negative for dysuria and hematuria.  Musculoskeletal:  Negative for neck pain and neck stiffness.  Skin:  Negative for rash.  Neurological:  Negative for dizziness and  weakness.  Psychiatric/Behavioral:  Negative for agitation and behavioral problems.       Objective:    Physical Exam Vitals reviewed.  Constitutional:      General: She is not in acute distress.    Appearance: She is not diaphoretic.  HENT:     Head: Normocephalic and atraumatic.     Nose: Nose normal. No congestion.     Mouth/Throat:  Mouth: Mucous membranes are moist.     Pharynx: No posterior oropharyngeal erythema.  Eyes:     General: No scleral icterus.    Extraocular Movements: Extraocular movements intact.  Cardiovascular:     Rate and Rhythm: Normal rate and regular rhythm.     Pulses: Normal pulses.     Heart sounds: Normal heart sounds. No murmur heard. Pulmonary:     Breath sounds: Normal breath sounds. No wheezing or rales.  Abdominal:     Palpations: Abdomen is soft.     Tenderness: There is no abdominal tenderness.  Musculoskeletal:     Cervical back: Neck supple. No tenderness.     Right lower leg: No edema.     Left lower leg: No edema.  Skin:    General: Skin is warm.     Findings: No rash.  Neurological:     General: No focal deficit present.     Mental Status: She is alert and oriented to person, place, and time.  Psychiatric:        Mood and Affect: Mood normal.        Behavior: Behavior normal.     BP 132/82 (BP Location: Right Arm, Patient Position: Sitting, Cuff Size: Normal)   Pulse 77   Resp 18   Ht _0  (1.702 m)   Wt 187 lb 3.2 oz (84.9 kg)   SpO2 98%   BMI 29.32 kg/m  Wt Readings from Last 3 Encounters:  08/04/22 187 lb 3.2 oz (84.9 kg)  05/25/22 196 lb (88.9 kg)  04/20/22 192 lb (87.1 kg)    Lab Results  Component Value Date   TSH 1.280 05/25/2022   Lab Results  Component Value Date   WBC 6.0 05/25/2022   HGB 13.2 05/25/2022   HCT 39.6 05/25/2022   MCV 87 05/25/2022   PLT 365 05/25/2022   Lab Results  Component Value Date   NA 140 05/25/2022   K 4.5 05/25/2022   CO2 24 05/25/2022   GLUCOSE 87 05/25/2022    BUN 8 05/25/2022   CREATININE 0.75 05/25/2022   BILITOT 0.2 05/25/2022   ALKPHOS 183 (H) 05/25/2022   AST 15 05/25/2022   ALT 15 05/25/2022   PROT 7.2 05/25/2022   ALBUMIN 4.7 05/25/2022   CALCIUM 9.3 05/25/2022   ANIONGAP 10 03/25/2019   EGFR 98 05/25/2022   Lab Results  Component Value Date   CHOL 234 (H) 05/25/2022   Lab Results  Component Value Date   HDL 45 05/25/2022   Lab Results  Component Value Date   LDLCALC 135 (H) 05/25/2022   Lab Results  Component Value Date   TRIG 298 (H) 05/25/2022   Lab Results  Component Value Date   CHOLHDL 5.2 (H) 05/25/2022   Lab Results  Component Value Date   HGBA1C 5.6 05/25/2022      Assessment & Plan:   Problem List Items Addressed This Visit       Digestive   Chronic gastritis without bleeding   Relevant Medications   omeprazole (PRILOSEC) 20 MG capsule     Other   Hyperlipidemia   Relevant Orders   CMP14+EGFR   Lipid Profile   Vitamin D deficiency   Relevant Orders   Vitamin D (25 hydroxy)   Depression, major, recurrent ([place]) - Primary   Relevant Medications   sertraline (ZOLOFT) 50 MG tablet   Other Relevant Orders   CMP14+EGFR   Other Visit Diagnoses     Need for immunization against  influenza       Relevant Orders   Flu Vaccine QUAD 64moIM (Fluarix, Fluzone & Alfiuria Quad PF) (Completed)   Depression, major, single episode, mild (H[facility])       Relevant Medications   sertraline (ZOLOFT) 50 MG tablet       Meds ordered this encounter  Medications   omeprazole (PRILOSEC) 20 MG capsule    Sig: Take 1 capsule (20 mg total) by mouth daily.    Dispense:  30 capsule    Refill:  3   sertraline (ZOLOFT) 50 MG tablet    Sig: Take 1 tablet (50 mg total) by mouth daily.    Dispense:  90 tablet    Refill:  1    Follow-up: Return in about 6 months (around 02/02/2023) for HLD and MDD.    RLindell Spar MD

## 2022-08-23 DIAGNOSIS — N920 Excessive and frequent menstruation with regular cycle: Secondary | ICD-10-CM | POA: Insufficient documentation

## 2022-08-23 DIAGNOSIS — D219 Benign neoplasm of connective and other soft tissue, unspecified: Secondary | ICD-10-CM | POA: Insufficient documentation

## 2022-08-31 ENCOUNTER — Encounter (HOSPITAL_COMMUNITY): Payer: Self-pay | Admitting: Emergency Medicine

## 2022-08-31 ENCOUNTER — Emergency Department (HOSPITAL_COMMUNITY): Payer: PRIVATE HEALTH INSURANCE

## 2022-08-31 ENCOUNTER — Other Ambulatory Visit: Payer: Self-pay

## 2022-08-31 ENCOUNTER — Emergency Department (HOSPITAL_COMMUNITY)
Admission: EM | Admit: 2022-08-31 | Discharge: 2022-08-31 | Disposition: A | Discharge status: Home / Self Care | Payer: PRIVATE HEALTH INSURANCE | Attending: Emergency Medicine | Admitting: Emergency Medicine

## 2022-08-31 DIAGNOSIS — M25462 Effusion, left knee: Secondary | ICD-10-CM | POA: Diagnosis not present

## 2022-08-31 DIAGNOSIS — M1712 Unilateral primary osteoarthritis, left knee: Secondary | ICD-10-CM | POA: Diagnosis not present

## 2022-08-31 DIAGNOSIS — M25562 Pain in left knee: Secondary | ICD-10-CM

## 2022-08-31 MED ORDER — IBUPROFEN 600 MG PO TABS: 600.0000 mg | ORAL_TABLET | Freq: Three times a day (TID) | ORAL | 0 refills | Status: AC | PRN

## 2022-08-31 NOTE — ED Triage Notes (Signed)
Pt c/o left knee pain after moving this past weekend

## 2022-08-31 NOTE — ED Provider Notes (Signed)
[facility] EMERGENCY DEPARTMENT Provider Note   CSN: [id number] Arrival date & time: 08/31/22  1550     History  Chief Complaint  Patient presents with   Knee Pain    Kelly Buchanan is a 51 y.o. female with PMH HLD who presents to the ED complaining of acute left knee pain x 3 days after moving this past weekend where she had to go up and down 2 flights of stairs repeatedly.  She denies any prior history of knee pain but has had intermittent swelling to bilateral knees for many years that was evaluated by her primary care last year.  She denies any direct injury to the knee, twisting, or fall.  She describes pain as to the inferior portion of the anterior knee just below the patella and denies any pain to the left lower leg, calf, foot, posterior knee, medial or lateral side of the knee. She denies other symptoms. 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  She is able to ambulate and has increased pain with weightbearing on the left side. She has taken no medication at home for her pain today.    Home Medications Prior to Admission medications   Medication Sig Start Date End Date Taking? Authorizing Provider  ibuprofen (ADVIL) 600 MG tablet Take 1 tablet (600 mg total) by mouth every 8 (eight) hours as needed for up to 7 days (pain and swelling). 08/31/22 09/07/22 Yes Maigan Bittinger L, PA-C  atorvastatin (LIPITOR) 40 MG tablet Take 1 tablet (40 mg total) by mouth daily. 11/16/21   Renee Rival, FNP  omeprazole (PRILOSEC) 20 MG capsule Take 1 capsule (20 mg total) by mouth daily. 08/04/22   Lindell Spar, MD  sertraline (ZOLOFT) 50 MG tablet Take 1 tablet (50 mg total) by mouth daily. 08/04/22   Lindell Spar, MD  Vitamin D, Ergocalciferol, (DRISDOL) 1.25 MG (50000 UNIT) CAPS capsule Take 1 capsule (50,000 Units total) by mouth every 7 (seven) days. 05/26/22   Lindell Spar, MD       Allergies    Patient has no known allergies.    Review of Systems   Review of Systems  Constitutional:  Positive for activity change. Negative for chills and fever.  HENT:  Negative for ear pain and sore throat.   Eyes:  Negative for pain and visual disturbance.  Respiratory:  Negative for cough, chest tightness and shortness of breath.   Cardiovascular:  Negative for chest pain and palpitations.  Gastrointestinal:  Negative for abdominal pain, diarrhea, nausea and vomiting.  Genitourinary:  Negative for dysuria and hematuria.  Musculoskeletal:  Negative for back pain.       L knee pain  Skin:  Negative for color change and rash.  Neurological:  Negative for seizures and syncope.  All other systems reviewed and are negative.   Physical Exam Updated Vital Signs BP 138/82 (BP Location: Right Arm)   Pulse 65   Temp 98 F (36.7 C) (Oral)   Resp (!) 22   Ht '5\' 7"'$  (1.702 m)   Wt 85.3 kg   LMP 08/01/2022 (Approximate)   SpO2 100%   BMI 29.44 kg/m  Physical Exam Vitals and nursing note reviewed.  Constitutional:      General: She is not in acute distress.    Appearance: Normal appearance. She is not ill-appearing.  HENT:  Head: Normocephalic and atraumatic.     Mouth/Throat:     Mouth: Mucous membranes are moist.  Eyes:     Conjunctiva/sclera: Conjunctivae normal.  Cardiovascular:     Rate and Rhythm: Normal rate and regular rhythm.     Heart sounds: Normal heart sounds. No murmur heard. Pulmonary:     Effort: Pulmonary effort is normal.     Breath sounds: Normal breath sounds.  Abdominal:     Palpations: Abdomen is soft.  Musculoskeletal:     Cervical back: Neck supple.     Right lower leg: No edema.     Comments: Full range of motion of the left knee, left ankle, left foot with strong DP and PT pulses, minimal tenderness over the patellar ligament, no tenderness to the posterior knee, calf, ankle, or remainder of anterior or medial/lateral knee, no erythema,  warmth, or wounds to LLE, moderate swelling to medial knee joint  Skin:    General: Skin is warm and dry.     Capillary Refill: Capillary refill takes less than 2 seconds.  Neurological:     Mental Status: She is alert. Mental status is at baseline.  Psychiatric:        Behavior: Behavior normal.     ED Results / Procedures / Treatments   Labs (all labs ordered are listed, but only abnormal results are displayed) Labs Reviewed - No data to display  EKG None  Radiology DG Knee Complete 4 Views Left  Result Date: 08/31/2022 CLINICAL DATA:  acute pain and swelling EXAM: LEFT KNEE - COMPLETE 4+ VIEW COMPARISON:  None Available. FINDINGS: There is no evidence of acute fracture. There is tricompartment osteophyte formation with moderate medial and lateral joint space narrowing. There is a moderate-sized joint effusion. Chronic bony fragment at the tip of the fibular head. IMPRESSION: No evidence of acute fracture.  Moderate-sized joint effusion. Moderate tricompartment osteoarthritis. Electronically Signed   By: Maurine Simmering M.D.   On: 08/31/2022 16:53    Procedures None  Medications Ordered in ED Medications - No data to display  ED Course/ Medical Decision Making/ A&P                           Medical Decision Making Amount and/or Complexity of Data Reviewed Radiology: ordered. Decision-making details documented in ED Course.   51 year old female presents with acute on chronic left knee pain and swelling.  Symptoms onset after recurrent stress to the knee when moving this past weekend.  She has full range of motion, is neurovascularly intact, and has had no direct trauma to the knee so unlikely that she has a fracture, dislocation, or other acute internal injury.  X-ray ordered for further assessment as she has never had imaging of the knee and this shows a moderate joint effusion and moderate osteoarthritis.  Discussed with patient need to rest and elevate leg, take NSAIDs to  manage inflammation though this should only be short term, and that she may use OTC brace if needed to continue with her work or other daily activities. Patient evaluated for similar symptoms at her primary care a year ago and there was a high suspicion for osteoarthritis so suspect this is continuation of disease.  She will follow-up with her primary care if symptoms continue and is aware that she may need an Ortho referral in the future if unable to manage symptoms conservatively.         Final Clinical Impression(s) /  ED Diagnoses Final diagnoses:  Effusion of left knee  Acute pain of left knee    Rx / DC Orders ED Discharge Orders          Ordered    ibuprofen (ADVIL) 600 MG tablet  Every 8 hours PRN        08/31/22 1706              Suzzette Righter, PA-C 08/31/22 1715    Noemi Chapel, MD 09/02/22 1512

## 2022-08-31 NOTE — Discharge Instructions (Signed)
Thank you for letting us take care of you today.  With your recent change in activity due to moving, you have developed some swelling or fluid build up in your left knee which is attributing to your pain. Your x-ray shows arthritis of the knee but no acute changes or broken bones. It is important you adequately rest the knee to help resolve symptoms and keep them from getting worse.  I have prescribed you ibuprofen '600mg'$  to take at home. You should not take this long term but may take over the next few days to help with the inflammation and pain in your knee.  Rest and elevate the leg when able and apply ice packs to help reduce the swelling.  If symptoms continue or worsen over the next week, please follow-up with your primary care doctor for further management of your arthritis.  If you develop fever, warmth or redness to the leg, chest pain, shortness of breath, or other concerning symptoms, you should return to the ED for further evaluation.

## 2022-09-01 ENCOUNTER — Telehealth: Payer: Self-pay

## 2022-09-01 NOTE — Telephone Encounter (Signed)
Transition Care Management Unsuccessful Follow-up Telephone Call TCM [place] 08-31-22 Dx: effusion left knee  Date of discharge and from where:    Attempts:  1st Attempt  Reason for unsuccessful TCM follow-up call:  Left voice message   Juanda Crumble LPN [place] Direct Dial [phone]

## 2022-09-07 NOTE — Telephone Encounter (Signed)
Transition Care Management Unsuccessful Follow-up Telephone Call  Date of discharge and from where:    [place] 08-31-22 Dx: effusion left knee    Attempts:  2nd Attempt  Reason for unsuccessful TCM follow-up call:  Left voice message   Juanda Crumble LPN [place] Direct Dial [phone]

## 2022-10-24 ENCOUNTER — Encounter: Payer: Self-pay | Admitting: Orthopedic Surgery

## 2022-10-24 ENCOUNTER — Ambulatory Visit (INDEPENDENT_AMBULATORY_CARE_PROVIDER_SITE_OTHER): Payer: PRIVATE HEALTH INSURANCE

## 2022-10-24 ENCOUNTER — Ambulatory Visit: Payer: PRIVATE HEALTH INSURANCE | Admitting: Orthopedic Surgery

## 2022-10-24 DIAGNOSIS — G8929 Other chronic pain: Secondary | ICD-10-CM

## 2022-10-24 DIAGNOSIS — M25562 Pain in left knee: Secondary | ICD-10-CM | POA: Diagnosis not present

## 2022-10-24 DIAGNOSIS — M23322 Other meniscus derangements, posterior horn of medial meniscus, left knee: Secondary | ICD-10-CM | POA: Diagnosis not present

## 2022-10-24 NOTE — Progress Notes (Signed)
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  Tenderness  The patient is experiencing no tenderness.   Range of Motion  The patient has normal right knee ROM.   Left Knee Exam   Muscle Strength  The patient has normal left knee strength.  Tenderness  The patient is experiencing tenderness in the medial joint line.  Range of Motion  Extension:  normal  Flexion:  normal   Tests  McMurray:  Medial - positive  Lachman:  Anterior - negative     Drawer:  Anterior - negative     Posterior - negative Patellar apprehension: negative  Other  Erythema: absent Scars: absent Sensation: normal Pulse: present Swelling: none Effusion: no effusion present      Imaging  Left knee x-ray 3 views   Acute pain after stepping up a flight of stairs   Alignment shows neutral tibiofemoral valgus angle mild narrowing of the lateral compartment of the right knee above the left knee   Osteophytes are seen lateral and medial side with no tilt   Impression grade 2 arthritis   Encounter Diagnoses  Name Primary?   Chronic pain of left knee Yes   Derangement of posterior horn of medial meniscus of left knee     She has all the signs and symptoms of a medial meniscus tear.  We will proceed with ordering an MRI to confirm  and if necessary proceed with surgery

## 2022-10-24 NOTE — Patient Instructions (Signed)
While we are working on your approval for MRI please go ahead and call to schedule your appointment with [facility] within at least one (1) week.   Central Scheduling [phone]  

## 2022-10-25 ENCOUNTER — Encounter: Payer: Self-pay | Admitting: Orthopedic Surgery

## 2022-11-10 ENCOUNTER — Ambulatory Visit (HOSPITAL_COMMUNITY)
Admission: RE | Admit: 2022-11-10 | Discharge: 2022-11-10 | Disposition: A | Discharge status: Home / Self Care | Payer: PRIVATE HEALTH INSURANCE | Source: Ambulatory Visit | Attending: Orthopedic Surgery | Admitting: Orthopedic Surgery

## 2022-11-10 DIAGNOSIS — G8929 Other chronic pain: Secondary | ICD-10-CM | POA: Insufficient documentation

## 2022-11-10 DIAGNOSIS — M25562 Pain in left knee: Secondary | ICD-10-CM | POA: Insufficient documentation

## 2022-11-11 HISTORY — PX: LAPAROSCOPIC TOTAL HYSTERECTOMY: SUR800

## 2022-11-12 ENCOUNTER — Other Ambulatory Visit: Payer: Self-pay | Admitting: Internal Medicine

## 2022-11-12 DIAGNOSIS — E559 Vitamin D deficiency, unspecified: Secondary | ICD-10-CM

## 2022-11-18 ENCOUNTER — Other Ambulatory Visit: Payer: Self-pay | Admitting: Nurse Practitioner

## 2022-11-24 ENCOUNTER — Ambulatory Visit: Payer: PRIVATE HEALTH INSURANCE | Admitting: Orthopedic Surgery

## 2022-11-24 DIAGNOSIS — M23322 Other meniscus derangements, posterior horn of medial meniscus, left knee: Secondary | ICD-10-CM

## 2022-11-24 DIAGNOSIS — Z90710 Acquired absence of both cervix and uterus: Secondary | ICD-10-CM | POA: Insufficient documentation

## 2022-11-24 DIAGNOSIS — M1712 Unilateral primary osteoarthritis, left knee: Secondary | ICD-10-CM

## 2022-11-24 MED ORDER — PREDNISONE 10 MG (48) PO TBPK: ORAL_TABLET | Freq: Every day | ORAL | 0 refills | Status: DC

## 2022-11-24 MED ORDER — METHYLPREDNISOLONE ACETATE 40 MG/ML IJ SUSP
40.0000 mg | Freq: Once | INTRAMUSCULAR | Status: AC
  Administered 2022-11-24: 40 mg via INTRA_ARTICULAR

## 2022-11-24 NOTE — Progress Notes (Addendum)
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  Her x-ray showed grade 2 arthritis she seems to have signs of meniscal tear and she was sent for MRI  The MRI is back.   My interpretation of the MRI is that the patient has arthritis of the joint and degenerative intrameniscal signal changes and may be some degenerative tearing of the medial meniscus    IMPRESSION: MRI 1. Mild-to-moderate tricompartmental osteoarthritis with intrasubstance degenerative tear of the body/posterior horn of the medial meniscus. 2. Moderate suprapatellar joint effusion. 3. Cruciate and collateral ligaments are intact. Quadriceps tendon and patellar tendon are also maintained. 4. No evidence of fracture or osteonecrosis.     Electronically Signed   By: Keane Police D.O.   On: 11/11/2022 10:49 We discussed the treatment options which include but not limited to  Continue anti-inflammatories ice and activity modification  Arthroscopic surgery  Knee replacement surgery  Injection  Procedure note left knee injection   verbal consent was obtained to inject left knee joint  Timeout was completed to confirm the site of injection  The medications used were depomedrol 40 mg and 1% lidocaine 3 cc Anesthesia was provided by ethyl chloride and the skin was prepped with alcohol.  After cleaning the skin with alcohol a 20-gauge needle was used to inject the left knee joint. There were no complications. A sterile bandage was applied.    NSAIDs  After reviewing all of the options the patient and I through shared decision making have decided to inject the knee try some prednisone before proceeding with arthroscopic surgery  Follow-up 4 weeks

## 2022-12-01 ENCOUNTER — Encounter: Payer: Self-pay | Admitting: Radiology

## 2022-12-08 ENCOUNTER — Ambulatory Visit: Payer: PRIVATE HEALTH INSURANCE | Admitting: Orthopedic Surgery

## 2022-12-20 ENCOUNTER — Other Ambulatory Visit: Payer: Self-pay

## 2022-12-20 MED ORDER — ATORVASTATIN CALCIUM 40 MG PO TABS: 40.0000 mg | ORAL_TABLET | Freq: Every day | ORAL | 3 refills | Status: DC

## 2022-12-22 ENCOUNTER — Ambulatory Visit: Payer: PRIVATE HEALTH INSURANCE | Admitting: Orthopedic Surgery

## 2022-12-26 ENCOUNTER — Ambulatory Visit: Payer: PRIVATE HEALTH INSURANCE | Admitting: Orthopedic Surgery

## 2022-12-26 NOTE — Progress Notes (Deleted)
   IMPRESSION: MRI 1. Mild-to-moderate tricompartmental osteoarthritis with intrasubstance degenerative tear of the body/posterior horn of the medial meniscus. 2. Moderate suprapatellar joint effusion. 3. Cruciate and collateral ligaments are intact. Quadriceps tendon and patellar tendon are also maintained. 4. No evidence of fracture or osteonecrosis.      Procedure note left knee injection

## 2023-01-03 ENCOUNTER — Encounter: Payer: Self-pay | Admitting: Family Medicine

## 2023-01-03 ENCOUNTER — Ambulatory Visit: Payer: PRIVATE HEALTH INSURANCE | Admitting: Family Medicine

## 2023-01-03 VITALS — BP 130/82 | HR 81 | Ht 67.0 in | Wt 199.0 lb

## 2023-01-03 DIAGNOSIS — G8929 Other chronic pain: Secondary | ICD-10-CM | POA: Diagnosis not present

## 2023-01-03 DIAGNOSIS — M5441 Lumbago with sciatica, right side: Secondary | ICD-10-CM | POA: Diagnosis not present

## 2023-01-03 MED ORDER — CYCLOBENZAPRINE HCL 5 MG PO TABS: 5.0000 mg | ORAL_TABLET | Freq: Three times a day (TID) | ORAL | 1 refills | Status: AC | PRN

## 2023-01-03 NOTE — Patient Instructions (Signed)
It was pleasure meeting with you today. Please take medications as prescribed. Follow up with your primary health provider if any health concerns arises. If symptoms worsen please contact your primary care provider and/or visit the emergency department.  

## 2023-01-03 NOTE — Progress Notes (Signed)
Patient Office Visit   Subjective   Patient ID: Kelly Buchanan, female    DOB: [birth year]  Age: 52 y.o. MRN: [id number]  CC:  Chief Complaint  Patient presents with   Back Pain    Patient complains of lower back pain starting this morning after bending over.     HPI Kelly Buchanan 52 year old female,presents to the clinic for back pain. She  has a past medical history of Chest heaviness (12/31/2019), Hyperhidrosis of axilla (09/06/2016), Hyperlipidemia, and Murmur.  Back Pain This is a chronic problem. The problem occurs constantly. The problem has been gradually worsening since onset. The pain is present in the lumbar spine. The quality of the pain is described as aching and shooting. The pain radiates to the right thigh. The pain is at a severity of 9/10. The pain is Worse during the day. The symptoms are aggravated by bending and standing. Pertinent negatives include no abdominal pain, bladder incontinence, bowel incontinence, chest pain, dysuria, fever, headaches, leg pain, numbness, paresthesias, perianal numbness or weakness. She has tried NSAIDs for the symptoms. The treatment provided no relief.      Outpatient Encounter Medications as of 01/03/2023  Medication Sig   atorvastatin (LIPITOR) 40 MG tablet Take 1 tablet (40 mg total) by mouth daily.   cyclobenzaprine (FLEXERIL) 5 MG tablet Take 1 tablet (5 mg total) by mouth 3 (three) times daily as needed for muscle spasms.   omeprazole (PRILOSEC) 20 MG capsule Take 1 capsule (20 mg total) by mouth daily.   predniSONE (STERAPRED UNI-PAK 48 TAB) 10 MG (48) TBPK tablet Take by mouth daily. 10 MG 12 DAYS DS   sertraline (ZOLOFT) 50 MG tablet Take 1 tablet (50 mg total) by mouth daily.   Vitamin D, Ergocalciferol, (DRISDOL) 1.25 MG (50000 UNIT) CAPS capsule TAKE 1 CAPSULE BY MOUTH EVERY 7 DAYS   No facility-administered encounter medications on file as of 01/03/2023.    Past Surgical History:  Procedure Laterality Date   APPENDECTOMY   1980's   COLONOSCOPY WITH PROPOFOL N/A 04/20/2022   Procedure: COLONOSCOPY WITH PROPOFOL;  Surgeon: Harvel Quale, MD;  Location: [facility];  Service: Gastroenterology;  Laterality: N/A;  [place]    Review of Systems  Constitutional:  Negative for chills and fever.  Respiratory:  Negative for shortness of breath.   Cardiovascular:  Negative for chest pain.  Gastrointestinal:  Negative for abdominal pain and bowel incontinence.  Genitourinary:  Negative for bladder incontinence and dysuria.  Musculoskeletal:  Positive for back pain.  Neurological:  Negative for dizziness, weakness, numbness, headaches and paresthesias.      Objective    BP 130/82   Pulse 81   Ht 5\' 7"  (1.702 m)   Wt 199 lb (90.3 kg)   SpO2 98%   BMI 31.17 kg/m   Physical Exam HENT:     Head: Normocephalic.  Cardiovascular:     Rate and Rhythm: Normal rate and regular rhythm.     Pulses: Normal pulses.     Heart sounds: Normal heart sounds.  Pulmonary:     Effort: Pulmonary effort is normal.     Breath sounds: Normal breath sounds.  Musculoskeletal:     Lumbar back: Tenderness present. No swelling, edema, deformity or signs of trauma. Normal range of motion. Positive right straight leg raise test. Negative left straight leg raise test.  Skin:    General: Skin is warm.  Capillary Refill: Capillary refill takes less than 2 seconds.  Neurological:     General: No focal deficit present.     Mental Status: She is alert.     Coordination: Coordination normal.     Gait: Gait normal.  Psychiatric:        Mood and Affect: Mood normal.       Assessment & Plan:  Chronic bilateral low back pain, unspecified whether sciatica present -     Cyclobenzaprine HCl; Take 1 tablet (5 mg total) by mouth 3 (three) times daily as needed for muscle spasms.  Dispense: 30 tablet; Refill: 1 -     DG Lumbar Spine 2-3 Views; Future  Chronic bilateral low back pain  with right-sided sciatica Assessment & Plan: Xray ordered- awaiting results will follow up Flexeril 5 mg as initial therapy Discussed medication desired effects, potential side effects. Non pharmacological interventions include rest, avoid twisting,improper bending, straining lower back. Demonstration of proper body mechanics. Alternate ice and heat. Recommend stretching back and legs. Follow up for worsening or persistent symptoms. Patient verbalizes understanding regarding plan of care and all questions answered.      No follow-ups on file.   Renard Hamper Ria Comment, FNP

## 2023-01-03 NOTE — Assessment & Plan Note (Addendum)
Xray ordered- awaiting results will follow up Flexeril 5 mg as initial therapy Discussed medication desired effects, potential side effects. Non pharmacological interventions include rest, avoid twisting,improper bending, straining lower back. Demonstration of proper body mechanics. Alternate ice and heat. Recommend stretching back and legs. Follow up for worsening or persistent symptoms. Patient verbalizes understanding regarding plan of care and all questions answered.

## 2023-01-05 ENCOUNTER — Ambulatory Visit (HOSPITAL_COMMUNITY)
Admission: RE | Admit: 2023-01-05 | Discharge: 2023-01-05 | Disposition: A | Discharge status: Home / Self Care | Payer: PRIVATE HEALTH INSURANCE | Source: Ambulatory Visit | Attending: Family Medicine | Admitting: Family Medicine

## 2023-01-05 DIAGNOSIS — G8929 Other chronic pain: Secondary | ICD-10-CM | POA: Diagnosis present

## 2023-01-05 DIAGNOSIS — M5441 Lumbago with sciatica, right side: Secondary | ICD-10-CM | POA: Insufficient documentation

## 2023-01-27 ENCOUNTER — Encounter: Payer: Self-pay | Admitting: Internal Medicine

## 2023-01-27 ENCOUNTER — Telehealth: Payer: PRIVATE HEALTH INSURANCE | Admitting: Internal Medicine

## 2023-01-27 DIAGNOSIS — Z20822 Contact with and (suspected) exposure to covid-19: Secondary | ICD-10-CM | POA: Diagnosis not present

## 2023-01-27 DIAGNOSIS — J069 Acute upper respiratory infection, unspecified: Secondary | ICD-10-CM | POA: Diagnosis not present

## 2023-01-27 MED ORDER — NOREL AD 4-10-325 MG PO TABS: 1.0000 | ORAL_TABLET | Freq: Three times a day (TID) | ORAL | 0 refills | Status: DC | PRN

## 2023-01-27 MED ORDER — AZITHROMYCIN 250 MG PO TABS: ORAL_TABLET | ORAL | 0 refills | Status: AC

## 2023-01-27 NOTE — Progress Notes (Signed)
Virtual Visit via Video Note   [name], she is at least at moderate risk for complications without adequate follow up.  This format is felt to be most appropriate for this patient at this time.  All issues noted in this document were discussed and addressed.  A limited physical exam was performed with this format.      Evaluation Performed:  Follow-up visit  Date:  01/27/2023   ID:  Kelly Buchanan, DOB 05/20/1971, MRN [id number]  Patient Location: Home Provider Location: Office/Clinic  Participants: Patient Location of Patient: Home Location of Provider: Telehealth Consent was obtain for visit to be over via telehealth. I verified that I am speaking with the correct person using two identifiers.  PCP:  Anabel Halon, MD   Chief Complaint: Sore throat and headache  History of Present Illness:    Kelly Buchanan is a 52 y.o. female who has a video visit for complaint of sore throat, sinus pressure related headache and myalgias for the last 3 days.  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  She denies any fever, dyspnea or wheezing currently.  The patient does have symptoms concerning for COVID-19 infection (fever, chills, cough, or new shortness of breath).   Past Medical, Surgical, Social History, Allergies, and Medications have been Reviewed.  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     Allergies:   Patient has no known allergies.   ROS:   Please see the history of present illness.     All other systems reviewed and are negative.   Labs/Other Tests and Data Reviewed:    Recent Labs: 05/25/2022: ALT 15; BUN 8; Creatinine, Ser 0.75; Hemoglobin 13.2; Platelets 365; Potassium 4.5; Sodium 140; TSH 1.280   Recent Lipid Panel Lab Results  Component Value Date/Time   CHOL 234 (H) 05/25/2022 02:38 PM   TRIG 298 (H) 05/25/2022 02:38 PM   HDL 45 05/25/2022 02:38 PM   CHOLHDL 5.2 (H) 05/25/2022 02:38 PM   CHOLHDL 4.5 12/03/2019 11:45 AM   LDLCALC 135 (H) 05/25/2022 02:38 PM   LDLCALC 163 (H) 12/03/2019 11:45 AM    Wt Readings from Last 3 Encounters:  01/03/23 199 lb (90.3 kg)  08/31/22 188 lb (85.3 kg)  08/04/22 187 lb 3.2 oz (84.9 kg)     Objective:    Vital Signs:  There were no vitals taken for this visit.   VITAL SIGNS:  reviewed GEN:  no acute distress EYES:  sclerae anicteric, EOMI - Extraocular Movements Intact RESPIRATORY:  normal respiratory effort, symmetric expansion NEURO:  alert and oriented x 3, no obvious focal deficit PSYCH:  normal affect  ASSESSMENT & PLAN:    Suspected COVID-19 infection Her symptoms are suggestive of viral URTI Advised to contact after checking home COVID test Continue  Flonase and Alka-Seltzer cold and sinus as needed Advised to maintain adequate hydration If negative COVID test with persistent symptoms, can consider antibiotic  I discussed the assessment and treatment plan with the patient. The patient was provided an opportunity to ask questions, and all were answered. The patient agreed with the plan and demonstrated an understanding of the instructions.   The patient was advised to call back or seek an in-person evaluation if the symptoms worsen or if the condition fails to improve as anticipated.  The above assessment and  management plan was discussed with the patient. The patient verbalized understanding of and has agreed to the management plan.   Medication Adjustments/Labs and Tests Ordered: Current medicines are reviewed at length with the patient today.  Concerns regarding medicines are outlined above.   Tests Ordered: No orders of the defined types were placed in this encounter.   Medication Changes: No orders of the defined types were placed in this encounter.    Note: This dictation was prepared with Dragon dictation along with smaller phrase technology. Similar sounding words can be transcribed inadequately or may not be corrected upon review. Any transcriptional errors that result from this process are unintentional.      Disposition:  Follow up  Signed, Anabel Halon, MD  01/27/2023 8:42 AM     [facility]

## 2023-02-03 ENCOUNTER — Ambulatory Visit: Payer: PRIVATE HEALTH INSURANCE | Admitting: Internal Medicine

## 2023-02-03 ENCOUNTER — Encounter: Payer: Self-pay | Admitting: Internal Medicine

## 2023-02-03 VITALS — BP 134/80 | HR 75 | Ht 67.0 in | Wt 199.8 lb

## 2023-02-03 DIAGNOSIS — G8929 Other chronic pain: Secondary | ICD-10-CM

## 2023-02-03 DIAGNOSIS — R739 Hyperglycemia, unspecified: Secondary | ICD-10-CM

## 2023-02-03 DIAGNOSIS — F3341 Major depressive disorder, recurrent, in partial remission: Secondary | ICD-10-CM

## 2023-02-03 DIAGNOSIS — I10 Essential (primary) hypertension: Secondary | ICD-10-CM | POA: Diagnosis not present

## 2023-02-03 DIAGNOSIS — F419 Anxiety disorder, unspecified: Secondary | ICD-10-CM | POA: Diagnosis not present

## 2023-02-03 DIAGNOSIS — E782 Mixed hyperlipidemia: Secondary | ICD-10-CM

## 2023-02-03 DIAGNOSIS — K2950 Unspecified chronic gastritis without bleeding: Secondary | ICD-10-CM

## 2023-02-03 DIAGNOSIS — M25512 Pain in left shoulder: Secondary | ICD-10-CM | POA: Diagnosis not present

## 2023-02-03 DIAGNOSIS — E559 Vitamin D deficiency, unspecified: Secondary | ICD-10-CM

## 2023-02-03 MED ORDER — HYDROXYZINE HCL 10 MG PO TABS: 10.0000 mg | ORAL_TABLET | Freq: Two times a day (BID) | ORAL | 0 refills | Status: AC | PRN

## 2023-02-03 MED ORDER — SERTRALINE HCL 50 MG PO TABS: 50.0000 mg | ORAL_TABLET | Freq: Every day | ORAL | 1 refills | Status: DC

## 2023-02-03 MED ORDER — MELOXICAM 7.5 MG PO TABS: 7.5000 mg | ORAL_TABLET | Freq: Every day | ORAL | 0 refills | Status: DC | PRN

## 2023-02-03 NOTE — Patient Instructions (Addendum)
Please take Meloxicam for left shoulder pain.  Please take Hydroxyzine as needed for situational anxiety.  Please continue to take medications as prescribed.  Please continue to follow DASH diet and perform moderate exercise/walking at least 150 mins/week.

## 2023-02-03 NOTE — Assessment & Plan Note (Signed)
Lipid profile reviewed from last blood work Continue atorvastatin for now Check lipid profile

## 2023-02-03 NOTE — Assessment & Plan Note (Signed)
Last vitamin D Lab Results  Component Value Date   VD25OH 15.7 (L) 05/25/2022   On vitamin D supplement 

## 2023-02-03 NOTE — Assessment & Plan Note (Signed)
Chronic, intermittent left shoulder pain Was told of bursitis by orthopedic surgeon Added Mobic as needed

## 2023-02-03 NOTE — Assessment & Plan Note (Addendum)
Due to stress at work - [employer] Has situational anxiety, started Hydroxyzine 10 mg PRN for now

## 2023-02-03 NOTE — Progress Notes (Signed)
Established Patient Office Visit  Subjective:  Patient ID: Kelly Buchanan, female    DOB: [birth year]  Age: 52 y.o. MRN: [id number]  CC:  Chief Complaint  Patient presents with   Hyperlipidemia    Six month follow up    HPI Kelly Buchanan is a 52 y.o. female with past medical history of HLD, OA, depression and GAD who presents for f/u of her chronic medical conditions.  HTN: BP is well controlled today.  She has had elevated BP readings in the past, but her BP has improved with diet modification.  MDD: She takes Zoloft currently.  Denies any anhedonia, spells of anxiety, SI or HI currently. She has had spells of anxiety when she has to do presentation at her workplace or in some other stressful situations.  HLD: She has started taking Lipitor regularly now.  Her last lipid profile showed elevated LDL, but she was taking Lipitor every other day at that time.  She has been taking vitamin D supplement and has been feeling improvement in her fatigue.  She reports chronic left shoulder pain since 09/02/22.  Denies any recent injury.  She has intermittent left shoulder pain, worse with repetitive movements and lying on left side.  She has tried Tylenol with mild relief.  She has chronic numbness and tingling of the left hand due to carpal tunnel syndrome.  Past Medical History:  Diagnosis Date   Chest heaviness 12/31/2019   Hyperhidrosis of axilla 09/06/2016   Hyperlipidemia    Murmur     Past Surgical History:  Procedure Laterality Date   APPENDECTOMY  06/04/1979   COLONOSCOPY WITH PROPOFOL N/A 04/20/2022   Procedure: COLONOSCOPY WITH PROPOFOL;  Surgeon: Dolores Frame, MD;  [facility]: [facility];  Service: Gastroenterology;  Laterality: N/A;  815   LAPAROSCOPIC TOTAL HYSTERECTOMY N/A 11/11/2022   TONSILLECTOMY  10/03/1997   TUBAL LIGATION  10/04/1995    Family History  Problem Relation Age of Onset   Heart disease Father 3       died at 36   Heart attack Father     Hypertension Father    Alcohol abuse Father    Hypertension Mother    Cancer Mother 63       rectal CA    Cancer Sister        hodgkins   Hypertension Sister    Obesity Sister    Cancer Maternal Aunt    Seizures Daughter     Social History   Socioeconomic History   Marital status: Single    Spouse name: Not on file   Number of children: 4   Years of education: 14   Highest education level: Some college, no degree  Occupational History   Occupation: Civil engineer, contracting: [employer]  Tobacco Use   Smoking status: Never   Smokeless tobacco: Never  Vaping Use   Vaping Use: Never used  Substance and Sexual Activity   Alcohol use: No    Alcohol/week: 0.0 standard drinks of alcohol   Drug use: No   Sexual activity: Yes    Birth control/protection: Surgical    Comment: tubal  Other Topics Concern   Not on file  Social History Narrative   Lives with Mother   Has four children (girls)   O[place] Parker 28   Imani Harrison 26   Danielle Harrison 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[employer]  Diet: overall well but likes to snack   Caffeine: 2 cups coffee mountain dew 20 oz every other day   Water: 1/2 gallon but rare      Wears seat belt   Does not wear sunscreens   Smoke detectors at home   Does not text and drive   Social Determinants of Health   Financial Resource Strain: Low Risk  (03/08/2022)   Overall Financial Resource Strain (CARDIA)    Difficulty of Paying Living Expenses: Not hard at all  Food Insecurity: No Food Insecurity (03/08/2022)   Hunger Vital Sign    Worried About Running Out of Food in the Last Year: Never true    Ran Out of Food in the Last Year: Never true  Transportation Needs: No Transportation Needs (03/08/2022)   PRAPARE - Administrator, Civil Service (Medical): No    Lack of Transportation (Non-Medical): No  Physical Activity: Insufficiently Active (03/08/2022)   Exercise Vital Sign     Days of Exercise per Week: 2 days    Minutes of Exercise per Session: 30 min  Stress: No Stress Concern Present (03/08/2022)   [employer] of Occupational Health - Occupational Stress Questionnaire    Feeling of Stress : Only a little  Social Connections: Moderately Isolated (03/08/2022)   Social Connection and Isolation Panel [NHANES]    Frequency of Communication with Friends and Family: More than three times a week    Frequency of Social Gatherings with Friends and Family: Once a week    Attends Religious Services: 1 to 4 times per year    Active Member of [employer] or Organizations: No    Attends Banker Meetings: Never    Marital Status: Never married  Intimate Partner Violence: Not At Risk (03/08/2022)   Humiliation, Afraid, Rape, and Kick questionnaire    Fear of Current or Ex-Partner: No    Emotionally Abused: No    Physically Abused: No    Sexually Abused: No    Outpatient Medications Prior to Visit  Medication Sig Dispense Refill   atorvastatin (LIPITOR) 40 MG tablet Take 1 tablet (40 mg total) by mouth daily. 90 tablet 3   cyclobenzaprine (FLEXERIL) 5 MG tablet Take 1 tablet (5 mg total) by mouth 3 (three) times daily as needed for muscle spasms. 30 tablet 1   omeprazole (PRILOSEC) 20 MG capsule Take 1 capsule (20 mg total) by mouth daily. 30 capsule 3   Vitamin D, Ergocalciferol, (DRISDOL) 1.25 MG (50000 UNIT) CAPS capsule TAKE 1 CAPSULE BY MOUTH EVERY 7 DAYS 12 capsule 1   Chlorphen-PE-Acetaminophen (NOREL AD) 4-10-325 MG TABS Take 1 tablet by mouth 3 (three) times daily as needed. 30 tablet 0   predniSONE (STERAPRED UNI-PAK 48 TAB) 10 MG (48) TBPK tablet Take by mouth daily. 10 MG 12 DAYS DS 48 tablet 0   sertraline (ZOLOFT) 50 MG tablet Take 1 tablet (50 mg total) by mouth daily. 90 tablet 1   No facility-administered medications prior to visit.    No Known Allergies  ROS Review of Systems  Constitutional:  Negative for chills and fever.  HENT:  Negative  for congestion, sinus pressure, sinus pain and sore throat.   Eyes:  Negative for pain and discharge.  Respiratory:  Negative for cough and shortness of breath.   Cardiovascular:  Negative for chest pain and palpitations.  Gastrointestinal:  Negative for abdominal pain, diarrhea, nausea and vomiting.  Endocrine: Negative for polydipsia and polyuria.  Genitourinary:  Negative for  dysuria and hematuria.  Musculoskeletal:  Positive for arthralgias (Left shoulder). Negative for neck pain and neck stiffness.  Skin:  Negative for rash.  Neurological:  Negative for dizziness and weakness.  Psychiatric/Behavioral:  Negative for agitation and behavioral problems. The patient is nervous/anxious.       Objective:    Physical Exam Vitals reviewed.  Constitutional:      General: She is not in acute distress.    Appearance: She is not diaphoretic.  HENT:     Head: Normocephalic and atraumatic.     Nose: Nose normal. No congestion.     Mouth/Throat:     Mouth: Mucous membranes are moist.     Pharynx: No posterior oropharyngeal erythema.  Eyes:     General: No scleral icterus.    Extraocular Movements: Extraocular movements intact.  Cardiovascular:     Rate and Rhythm: Normal rate and regular rhythm.     Pulses: Normal pulses.     Heart sounds: Normal heart sounds. No murmur heard. Pulmonary:     Breath sounds: Normal breath sounds. No wheezing or rales.  Musculoskeletal:     Left shoulder: Tenderness (Mild) present. Normal range of motion.     Cervical back: Neck supple. No tenderness.     Right lower leg: No edema.     Left lower leg: No edema.  Skin:    General: Skin is warm.     Findings: No rash.  Neurological:     General: No focal deficit present.     Mental Status: She is alert and oriented to person, place, and time.  Psychiatric:        Mood and Affect: Mood normal.        Behavior: Behavior normal.     BP 134/80 (BP Location: Left Arm, Cuff Size: Normal)   Pulse 75    Ht 5\' 7"  (1.702 m)   Wt 199 lb 12.8 oz (90.6 kg)   SpO2 98%   BMI 31.29 kg/m  Wt Readings from Last 3 Encounters:  02/03/23 199 lb 12.8 oz (90.6 kg)  01/03/23 199 lb (90.3 kg)  08/31/22 188 lb (85.3 kg)    Lab Results  Component Value Date   TSH 1.280 05/25/2022   Lab Results  Component Value Date   WBC 6.0 05/25/2022   HGB 13.2 05/25/2022   HCT 39.6 05/25/2022   MCV 87 05/25/2022   PLT 365 05/25/2022   Lab Results  Component Value Date   NA 140 05/25/2022   K 4.5 05/25/2022   CO2 24 05/25/2022   GLUCOSE 87 05/25/2022   BUN 8 05/25/2022   CREATININE 0.75 05/25/2022   BILITOT 0.2 05/25/2022   ALKPHOS 183 (H) 05/25/2022   AST 15 05/25/2022   ALT 15 05/25/2022   PROT 7.2 05/25/2022   ALBUMIN 4.7 05/25/2022   CALCIUM 9.3 05/25/2022   ANIONGAP 10 03/25/2019   EGFR 98 05/25/2022   Lab Results  Component Value Date   CHOL 234 (H) 05/25/2022   Lab Results  Component Value Date   HDL 45 05/25/2022   Lab Results  Component Value Date   LDLCALC 135 (H) 05/25/2022   Lab Results  Component Value Date   TRIG 298 (H) 05/25/2022   Lab Results  Component Value Date   CHOLHDL 5.2 (H) 05/25/2022   Lab Results  Component Value Date   HGBA1C 5.6 05/25/2022      Assessment & Plan:   Problem List Items Addressed This Visit  Cardiovascular and Mediastinum   Essential hypertension - Primary    BP Readings from Last 1 Encounters:  02/03/23 134/80  Well-controlled with diet modification Advised DASH diet and moderate exercise/walking, at least 150 mins/week      Relevant Orders   CMP14+EGFR   CBC with Differential/Platelet     Digestive   Chronic gastritis without bleeding    Had acid reflux with daily products Has omeprazole PRN Advised to avoid hot and spicy food Can avoid daily products, may have lactose intolerance        Other   Depression, major, recurrent (HCC) (Chronic)    Well-controlled with Zoloft Has situational anxiety, started  Hydroxyzine 10 mg PRN for now If persistent, can increase dose of Zoloft      Relevant Medications   hydrOXYzine (ATARAX) 10 MG tablet   sertraline (ZOLOFT) 50 MG tablet   Other Relevant Orders   TSH   CMP14+EGFR   CBC with Differential/Platelet   Hyperlipidemia    Lipid profile reviewed from last blood work Continue atorvastatin for now Check lipid profile      Relevant Orders   Lipid panel   Vitamin D deficiency    Last vitamin D Lab Results  Component Value Date   VD25OH 15.7 (L) 05/25/2022  On vitamin D supplement      Relevant Orders   VITAMIN D 25 Hydroxy (Vit-D Deficiency, Fractures)   Anxiety    Due to stress at work - [employer] Has situational anxiety, started Hydroxyzine 10 mg PRN for now       Relevant Medications   hydrOXYzine (ATARAX) 10 MG tablet   sertraline (ZOLOFT) 50 MG tablet   Chronic left shoulder pain   Relevant Medications   sertraline (ZOLOFT) 50 MG tablet   meloxicam (MOBIC) 7.5 MG tablet   Other Visit Diagnoses     Hyperglycemia       Relevant Orders   Hemoglobin A1c      Meds ordered this encounter  Medications   hydrOXYzine (ATARAX) 10 MG tablet    Sig: Take 1 tablet (10 mg total) by mouth 2 (two) times daily as needed for anxiety.    Dispense:  30 tablet    Refill:  0   sertraline (ZOLOFT) 50 MG tablet    Sig: Take 1 tablet (50 mg total) by mouth daily.    Dispense:  90 tablet    Refill:  1   meloxicam (MOBIC) 7.5 MG tablet    Sig: Take 1 tablet (7.5 mg total) by mouth daily as needed for pain.    Dispense:  30 tablet    Refill:  0    Follow-up: Return in about 3 months (around 05/06/2023) for Annual physical.    Anabel Halon, MD

## 2023-02-03 NOTE — Assessment & Plan Note (Signed)
BP Readings from Last 1 Encounters:  02/03/23 134/80   Well-controlled with diet modification Advised DASH diet and moderate exercise/walking, at least 150 mins/week

## 2023-02-03 NOTE — Assessment & Plan Note (Signed)
Had acid reflux with daily products Has omeprazole PRN Advised to avoid hot and spicy food Can avoid daily products, may have lactose intolerance

## 2023-02-03 NOTE — Assessment & Plan Note (Addendum)
Well-controlled with Zoloft Has situational anxiety, started Hydroxyzine 10 mg PRN for now If persistent, can increase dose of Zoloft

## 2023-02-04 LAB — CBC WITH DIFFERENTIAL/PLATELET
Basophils Absolute: 0 10*3/uL (ref 0.0–0.2)
Basos: 1 %
EOS (ABSOLUTE): 0.1 10*3/uL (ref 0.0–0.4)
Eos: 1 %
Hematocrit: 37.6 % (ref 34.0–46.6)
Hemoglobin: 12.7 g/dL (ref 11.1–15.9)
Immature Grans (Abs): 0 10*3/uL (ref 0.0–0.1)
Immature Granulocytes: 0 %
Lymphocytes Absolute: 1.9 10*3/uL (ref 0.7–3.1)
Lymphs: 25 %
MCH: 29.2 pg (ref 26.6–33.0)
MCHC: 33.8 g/dL (ref 31.5–35.7)
MCV: 86 fL (ref 79–97)
Monocytes Absolute: 0.6 10*3/uL (ref 0.1–0.9)
Monocytes: 8 %
Neutrophils Absolute: 4.8 10*3/uL (ref 1.4–7.0)
Neutrophils: 65 %
Platelets: 345 10*3/uL (ref 150–450)
RBC: 4.35 x10E6/uL (ref 3.77–5.28)
RDW: 13.2 % (ref 11.7–15.4)
WBC: 7.3 10*3/uL (ref 3.4–10.8)

## 2023-02-04 LAB — LIPID PANEL
Chol/HDL Ratio: 3.5 ratio (ref 0.0–4.4)
Cholesterol, Total: 177 mg/dL (ref 100–199)
HDL: 51 mg/dL (ref >39)
LDL Chol Calc (NIH): 106 mg/dL — ABNORMAL HIGH (ref 0–99)
Triglycerides: 114 mg/dL (ref 0–149)
VLDL Cholesterol Cal: 20 mg/dL (ref 5–40)

## 2023-02-04 LAB — CMP14+EGFR
ALT: 24 IU/L (ref 0–32)
AST: 18 IU/L (ref 0–40)
Albumin/Globulin Ratio: 1.6 (ref 1.2–2.2)
Albumin: 4.4 g/dL (ref 3.9–4.9)
Alkaline Phosphatase: 196 IU/L — ABNORMAL HIGH (ref 44–121)
BUN/Creatinine Ratio: 13 (ref 9–23)
BUN: 9 mg/dL (ref 6–24)
Bilirubin Total: 0.4 mg/dL (ref 0.0–1.2)
CO2: 23 mmol/L (ref 20–29)
Calcium: 9.7 mg/dL (ref 8.7–10.2)
Chloride: 100 mmol/L (ref 96–106)
Creatinine, Ser: 0.72 mg/dL (ref 0.57–1.00)
Globulin, Total: 2.8 g/dL (ref 1.5–4.5)
Glucose: 89 mg/dL (ref 70–99)
Potassium: 4.1 mmol/L (ref 3.5–5.2)
Sodium: 138 mmol/L (ref 134–144)
Total Protein: 7.2 g/dL (ref 6.0–8.5)
eGFR: 102 mL/min/{1.73_m2} (ref >59)

## 2023-02-04 LAB — HEMOGLOBIN A1C
Est. average glucose Bld gHb Est-mCnc: 117 mg/dL
Hgb A1c MFr Bld: 5.7 % — ABNORMAL HIGH (ref 4.8–5.6)

## 2023-02-04 LAB — TSH: TSH: 1.05 u[IU]/mL (ref 0.450–4.500)

## 2023-02-04 LAB — VITAMIN D 25 HYDROXY (VIT D DEFICIENCY, FRACTURES): Vit D, 25-Hydroxy: 40.5 ng/mL (ref 30.0–100.0)

## 2023-03-07 ENCOUNTER — Telehealth: Payer: Self-pay | Admitting: Internal Medicine

## 2023-03-07 NOTE — Telephone Encounter (Signed)
Certification participate Physical fitness assessment  Copied Noted Sleeved  Call patient when ready [phone]

## 2023-03-08 ENCOUNTER — Other Ambulatory Visit: Payer: Self-pay | Admitting: Internal Medicine

## 2023-03-08 DIAGNOSIS — G8929 Other chronic pain: Secondary | ICD-10-CM

## 2023-03-10 NOTE — Telephone Encounter (Signed)
Patient picked up forms.

## 2023-03-10 NOTE — Telephone Encounter (Signed)
Pt aware form ready for pick up at front desk.

## 2023-03-12 ENCOUNTER — Other Ambulatory Visit: Payer: Self-pay | Admitting: Internal Medicine

## 2023-03-12 DIAGNOSIS — F3341 Major depressive disorder, recurrent, in partial remission: Secondary | ICD-10-CM

## 2023-04-02 ENCOUNTER — Encounter: Payer: Self-pay | Admitting: Internal Medicine

## 2023-05-08 ENCOUNTER — Ambulatory Visit: Payer: No Typology Code available for payment source | Admitting: Internal Medicine

## 2023-05-08 ENCOUNTER — Encounter: Payer: Self-pay | Admitting: Internal Medicine

## 2023-05-08 VITALS — BP 128/80 | HR 76 | Ht 67.0 in | Wt 197.4 lb

## 2023-05-08 DIAGNOSIS — G8929 Other chronic pain: Secondary | ICD-10-CM

## 2023-05-08 DIAGNOSIS — I10 Essential (primary) hypertension: Secondary | ICD-10-CM | POA: Diagnosis not present

## 2023-05-08 DIAGNOSIS — M5442 Lumbago with sciatica, left side: Secondary | ICD-10-CM

## 2023-05-08 DIAGNOSIS — F3341 Major depressive disorder, recurrent, in partial remission: Secondary | ICD-10-CM

## 2023-05-08 DIAGNOSIS — F419 Anxiety disorder, unspecified: Secondary | ICD-10-CM

## 2023-05-08 DIAGNOSIS — Z1231 Encounter for screening mammogram for malignant neoplasm of breast: Secondary | ICD-10-CM

## 2023-05-08 DIAGNOSIS — K2950 Unspecified chronic gastritis without bleeding: Secondary | ICD-10-CM

## 2023-05-08 MED ORDER — LIDOCAINE 5 % EX PTCH
1.0000 | MEDICATED_PATCH | CUTANEOUS | 0 refills | Status: AC
Start: 2023-05-08 — End: ?

## 2023-05-08 MED ORDER — METHYLPREDNISOLONE 4 MG PO TBPK
ORAL_TABLET | ORAL | 0 refills | Status: AC
Start: 2023-05-08 — End: ?

## 2023-05-08 MED ORDER — OMEPRAZOLE 40 MG PO CPDR
40.0000 mg | DELAYED_RELEASE_CAPSULE | Freq: Every day | ORAL | 3 refills | Status: AC | PRN
Start: 2023-05-08 — End: ?

## 2023-05-08 NOTE — Assessment & Plan Note (Signed)
Well-controlled with Zoloft Has situational anxiety, started Hydroxyzine 10 mg PRN for now If persistent, can increase dose of Zoloft

## 2023-05-08 NOTE — Assessment & Plan Note (Signed)
Due to stress at work - [employer] Has situational anxiety, has Hydroxyzine 10 mg PRN for now

## 2023-05-08 NOTE — Assessment & Plan Note (Addendum)
BP Readings from Last 1 Encounters:  05/08/23 128/80   Well-controlled with diet modification Advised DASH diet and moderate exercise/walking, at least 150 mins/week

## 2023-05-08 NOTE — Progress Notes (Signed)
Established Patient Office Visit  Subjective:  Patient ID: Kelly Buchanan, female    DOB: [birth year]  Age: 52 y.o. MRN: [id number]  CC:  Chief Complaint  Patient presents with   Hypertension    Three month follow up    Back Pain    Lower back pain and pressure     HPI Kelly Buchanan is a 52 y.o. female with past medical history of HLD, OA, depression and GAD who presents for f/u of her chronic medical conditions.  HTN: BP is well controlled today.  She has had elevated BP readings in the past, but her BP has improved with diet modification.  MDD: She takes Zoloft currently.  Denies any anhedonia, spells of anxiety, SI or HI currently. She has had spells of anxiety when she has to do presentation at her workplace or in some other stressful situations, but improve with hydroxyzine.  HLD: She has started taking Lipitor regularly now.  Her last lipid profile showed elevated LDL, but improved compared to prior.  She has been taking vitamin D supplement and has been feeling improvement in her fatigue.  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  She had  x-ray of the lumbar spine, which was unremarkable.  She has been physical therapy without much benefit.  She has tried Tylenol and Flexeril without much relief.  Denies any numbness or tingling of the LE.  Denies any urinary or stool incontinence.  Past Medical History:  Diagnosis Date   Chest heaviness 12/31/2019   Hyperhidrosis of axilla 09/06/2016   Hyperlipidemia    Murmur     Past Surgical History:  Procedure Laterality Date   APPENDECTOMY  06/04/1979   COLONOSCOPY WITH PROPOFOL N/A 04/20/2022   Procedure: COLONOSCOPY WITH PROPOFOL;  Surgeon: Dolores Frame, MD;  Location: [facility];  Service: Gastroenterology;  Laterality: N/A;  815   LAPAROSCOPIC TOTAL HYSTERECTOMY N/A 11/11/2022   TONSILLECTOMY   10/03/1997   TUBAL LIGATION  10/04/1995    Family History  Problem Relation Age of Onset   Heart disease Father 106       died at 11   Heart attack Father    Hypertension Father    Alcohol abuse Father    Hypertension Mother    Cancer Mother 81       rectal CA    Cancer Sister        hodgkins   Hypertension Sister    Obesity Sister    Cancer Maternal Aunt    Seizures Daughter     Social History   Socioeconomic History   Marital status: Single    Spouse name: Not on file   Number of children: 4   Years of education: 14   Highest education level: Some college, no degree  Occupational History   Occupation: Civil engineer, contracting: [employer]   Tobacco Use   Smoking status: Never   Smokeless tobacco: Never  Vaping Use   Vaping status: Never Used  Substance and Sexual Activity   Alcohol use: No    Alcohol/week: 0.0 standard drinks of alcohol   Drug use: No   Sexual activity: Yes    Birth control/protection: Surgical    Comment: tubal  Other Topics Concern   Not on file  Social History Narrative   Lives with Mother   Has four children (girls)  Kelly Buchanan [address] 27   Kelly Buchanan [address] 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[employer]       Diet: overall well but likes to snack   Caffeine: 2 cups coffee mountain dew 20 oz every other day   Water: 1/2 gallon but rare      Wears seat belt   Does not wear sunscreens   Smoke detectors at home   Does not text and drive   Social Determinants of Corporate investment banker Strain: Low Risk  (05/07/2023)   Overall Financial Resource Strain (CARDIA)    Difficulty of Paying Living Expenses: Not hard at all  Food Insecurity: No Food Insecurity (05/07/2023)   Hunger Vital Sign    Worried About Running Out of Food in the Last Year: Never true    Ran Out of Food in the Last Year: Never true  Transportation Needs: No Transportation Needs (05/07/2023)   PRAPARE - Therapist, art (Medical): No    Lack of Transportation (Non-Medical): No  Physical Activity: Insufficiently Active (05/07/2023)   Exercise Vital Sign    Days of Exercise per Week: 2 days    Minutes of Exercise per Session: 20 min  Stress: No Stress Concern Present (05/07/2023)   [employer] of Occupational Health - Occupational Stress Questionnaire    Feeling of Stress : Only a little  Social Connections: Moderately Isolated (05/07/2023)   Social Connection and Isolation Panel [NHANES]    Frequency of Communication with Friends and Family: More than three times a week    Frequency of Social Gatherings with Friends and Family: Once a week    Attends Religious Services: More than 4 times per year    Active Member of [employer] or Organizations: No    Attends Engineer, structural: Not on file    Marital Status: Never married  Intimate Partner Violence: Not At Risk (03/08/2022)   Humiliation, Afraid, Rape, and Kick questionnaire    Fear of Current or Ex-Partner: No    Emotionally Abused: No    Physically Abused: No    Sexually Abused: No    Outpatient Medications Prior to Visit  Medication Sig Dispense Refill   atorvastatin (LIPITOR) 40 MG tablet Take 1 tablet (40 mg total) by mouth daily. 90 tablet 3   cyclobenzaprine (FLEXERIL) 5 MG tablet Take 1 tablet (5 mg total) by mouth 3 (three) times daily as needed for muscle spasms. 30 tablet 1   hydrOXYzine (ATARAX) 10 MG tablet Take 1 tablet (10 mg total) by mouth 2 (two) times daily as needed for anxiety. 30 tablet 0   meloxicam (MOBIC) 7.5 MG tablet TAKE 1 TABLET(7.5 MG) BY MOUTH DAILY AS NEEDED FOR PAIN 30 tablet 0   sertraline (ZOLOFT) 50 MG tablet TAKE 1 TABLET(50 MG) BY MOUTH DAILY 90 tablet 1   Vitamin D, Ergocalciferol, (DRISDOL) 1.25 MG (50000 UNIT) CAPS capsule TAKE 1 CAPSULE BY MOUTH EVERY 7 DAYS 12 capsule 1   omeprazole (PRILOSEC) 20 MG capsule Take 1 capsule (20 mg total) by mouth daily. 30 capsule 3   No  facility-administered medications prior to visit.    No Known Allergies  ROS Review of Systems  Constitutional:  Negative for chills and fever.  HENT:  Negative for congestion, sinus pressure, sinus pain and sore throat.   Eyes:  Negative for pain and discharge.  Respiratory:  Negative for cough and shortness  of breath.   Cardiovascular:  Negative for chest pain and palpitations.  Gastrointestinal:  Negative for abdominal pain, diarrhea, nausea and vomiting.  Endocrine: Negative for polydipsia and polyuria.  Genitourinary:  Negative for dysuria and hematuria.  Musculoskeletal:  Positive for arthralgias (Left shoulder) and back pain. Negative for neck pain and neck stiffness.  Skin:  Negative for rash.  Neurological:  Negative for dizziness and weakness.  Psychiatric/Behavioral:  Negative for agitation and behavioral problems. The patient is nervous/anxious.       Objective:    Physical Exam Vitals reviewed.  Constitutional:      General: She is not in acute distress.    Appearance: She is not diaphoretic.  HENT:     Head: Normocephalic and atraumatic.     Nose: Nose normal. No congestion.     Mouth/Throat:     Mouth: Mucous membranes are moist.     Pharynx: No posterior oropharyngeal erythema.  Eyes:     General: No scleral icterus.    Extraocular Movements: Extraocular movements intact.  Cardiovascular:     Rate and Rhythm: Normal rate and regular rhythm.     Pulses: Normal pulses.     Heart sounds: Normal heart sounds. No murmur heard. Pulmonary:     Breath sounds: Normal breath sounds. No wheezing or rales.  Musculoskeletal:     Left shoulder: Tenderness (Mild) present. Normal range of motion.     Cervical back: Neck supple. No tenderness.     Lumbar back: Tenderness present. Positive left straight leg raise test. Negative right straight leg raise test.     Right lower leg: No edema.     Left lower leg: No edema.  Skin:    General: Skin is warm.     Findings: No  rash.  Neurological:     General: No focal deficit present.     Mental Status: She is alert and oriented to person, place, and time.  Psychiatric:        Mood and Affect: Mood normal.        Behavior: Behavior normal.     BP 128/80 (BP Location: Right Arm)   Pulse 76   Ht 5\' 7"  (1.702 m)   Wt 197 lb 6.4 oz (89.5 kg)   SpO2 97%   BMI 30.92 kg/m  Wt Readings from Last 3 Encounters:  05/08/23 197 lb 6.4 oz (89.5 kg)  02/03/23 199 lb 12.8 oz (90.6 kg)  01/03/23 199 lb (90.3 kg)    Lab Results  Component Value Date   TSH 1.050 02/03/2023   Lab Results  Component Value Date   WBC 7.3 02/03/2023   HGB 12.7 02/03/2023   HCT 37.6 02/03/2023   MCV 86 02/03/2023   PLT 345 02/03/2023   Lab Results  Component Value Date   NA 138 02/03/2023   K 4.1 02/03/2023   CO2 23 02/03/2023   GLUCOSE 89 02/03/2023   BUN 9 02/03/2023   CREATININE 0.72 02/03/2023   BILITOT 0.4 02/03/2023   ALKPHOS 196 (H) 02/03/2023   AST 18 02/03/2023   ALT 24 02/03/2023   PROT 7.2 02/03/2023   ALBUMIN 4.4 02/03/2023   CALCIUM 9.7 02/03/2023   ANIONGAP 10 03/25/2019   EGFR 102 02/03/2023   Lab Results  Component Value Date   CHOL 177 02/03/2023   Lab Results  Component Value Date   HDL 51 02/03/2023   Lab Results  Component Value Date   LDLCALC 106 (H) 02/03/2023   Lab Results  Component Value Date   TRIG 114 02/03/2023   Lab Results  Component Value Date   CHOLHDL 3.5 02/03/2023   Lab Results  Component Value Date   HGBA1C 5.7 (H) 02/03/2023      Assessment & Plan:   Problem List Items Addressed This Visit       Cardiovascular and Mediastinum   Essential hypertension - Primary    BP Readings from Last 1 Encounters:  05/08/23 128/80   Well-controlled with diet modification Advised DASH diet and moderate exercise/walking, at least 150 mins/week        Digestive   Chronic gastritis without bleeding    Had acid reflux with daily products Omeprazole 40 mg QD as  needed Advised to avoid hot and spicy food Can avoid daily products, may have lactose intolerance      Relevant Medications   omeprazole (PRILOSEC) 40 MG capsule     Nervous and Auditory   Chronic bilateral low back pain with left-sided sciatica    Likely due to wearing belt with heavy equipment at work Has tried Tylenol, Flexeril, IM injections of Toradol and steroids Has tried physical therapy X-ray of lumbar spine unremarkable Likely has paraspinal muscle strain Medrol Dosepak and as needed lidocaine patch Needs to perform simple back exercises regularly If persistent, may need MRI of lumbar spine      Relevant Medications   methylPREDNISolone (MEDROL DOSEPAK) 4 MG TBPK tablet   lidocaine (LIDODERM) 5 %     Other   Depression, major, recurrent (HCC) (Chronic)    Well-controlled with Zoloft Has situational anxiety, started Hydroxyzine 10 mg PRN for now If persistent, can increase dose of Zoloft      Anxiety    Due to stress at work - [employer] Has situational anxiety, has Hydroxyzine 10 mg PRN for now      Other Visit Diagnoses     Encounter for screening mammogram for malignant neoplasm of breast       Relevant Orders   MM 3D SCREENING MAMMOGRAM BILATERAL BREAST       Meds ordered this encounter  Medications   omeprazole (PRILOSEC) 40 MG capsule    Sig: Take 1 capsule (40 mg total) by mouth daily as needed.    Dispense:  30 capsule    Refill:  3   methylPREDNISolone (MEDROL DOSEPAK) 4 MG TBPK tablet    Sig: Take as package instructions.    Dispense:  1 each    Refill:  0   lidocaine (LIDODERM) 5 %    Sig: Place 1 patch onto the skin daily. Remove & Discard patch within 12 hours or as directed by MD    Dispense:  30 patch    Refill:  0    Follow-up: Return in about 4 months (around 09/07/2023) for MDD and back pain.    Anabel Halon, MD

## 2023-05-08 NOTE — Assessment & Plan Note (Addendum)
Had acid reflux with daily products Omeprazole 40 mg QD as needed Advised to avoid hot and spicy food Can avoid daily products, may have lactose intolerance

## 2023-05-08 NOTE — Assessment & Plan Note (Signed)
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

## 2023-05-08 NOTE — Patient Instructions (Addendum)
Please take Prednisone as prescribed for back pain. Please do not take Meloxicam while taking Prednisone. Please use Lidocaine patch as needed for back pain.  Please take Omeprazole as needed for acid reflux. Avoid hot and spicy food.  Please continue to take medications as prescribed.  Please continue to follow DASH diet and perform moderate exercise/walking at least 150 mins/week.

## 2023-05-18 ENCOUNTER — Ambulatory Visit (HOSPITAL_COMMUNITY)
Admission: RE | Admit: 2023-05-18 | Discharge: 2023-05-18 | Disposition: A | Discharge status: Home / Self Care | Payer: No Typology Code available for payment source | Source: Ambulatory Visit | Attending: Internal Medicine | Admitting: Internal Medicine

## 2023-05-18 ENCOUNTER — Encounter (HOSPITAL_COMMUNITY): Payer: Self-pay

## 2023-05-18 DIAGNOSIS — Z1231 Encounter for screening mammogram for malignant neoplasm of breast: Secondary | ICD-10-CM | POA: Insufficient documentation

## 2023-05-30 ENCOUNTER — Telehealth: Payer: No Typology Code available for payment source | Admitting: Physician Assistant

## 2023-05-30 DIAGNOSIS — J069 Acute upper respiratory infection, unspecified: Secondary | ICD-10-CM

## 2023-05-30 MED ORDER — BENZONATATE 100 MG PO CAPS: 100.0000 mg | ORAL_CAPSULE | Freq: Three times a day (TID) | ORAL | 0 refills | Status: AC | PRN

## 2023-05-30 MED ORDER — FLUTICASONE PROPIONATE 50 MCG/ACT NA SUSP: 2.0000 | Freq: Every day | NASAL | 0 refills | Status: AC

## 2023-05-30 NOTE — Progress Notes (Signed)

## 2023-05-30 NOTE — Progress Notes (Signed)
I have spent 5 minutes in review of e-visit questionnaire, review and updating patient chart, medical decision making and response to patient.   William Cody Martin, PA-C    

## 2023-06-01 ENCOUNTER — Other Ambulatory Visit: Payer: Self-pay | Admitting: Internal Medicine

## 2023-06-01 ENCOUNTER — Encounter: Payer: Self-pay | Admitting: Internal Medicine

## 2023-06-01 DIAGNOSIS — J0110 Acute frontal sinusitis, unspecified: Secondary | ICD-10-CM

## 2023-06-01 MED ORDER — AMOXICILLIN-POT CLAVULANATE 875-125 MG PO TABS
1.0000 | ORAL_TABLET | Freq: Two times a day (BID) | ORAL | 0 refills | Status: AC
Start: 2023-06-01 — End: ?

## 2023-09-07 ENCOUNTER — Ambulatory Visit: Payer: No Typology Code available for payment source | Admitting: Internal Medicine

## 2023-10-19 ENCOUNTER — Ambulatory Visit: Payer: No Typology Code available for payment source | Admitting: Internal Medicine

## 2024-01-03 ENCOUNTER — Other Ambulatory Visit: Payer: Self-pay | Admitting: Internal Medicine

## 2024-01-03 MED ORDER — ATORVASTATIN CALCIUM 40 MG PO TABS: 40.0000 mg | ORAL_TABLET | Freq: Every day | ORAL | 3 refills | Status: AC

## 2024-01-03 NOTE — Telephone Encounter (Signed)
 Copied from CRM [id number]. Topic: Clinical - Medication Refill >> Jan 03, 2024 10:03 AM Almira Coaster wrote: Most Recent Primary Care Visit:  Provider: Anabel Halon  Department: [facility]  Visit Type: OFFICE VISIT  Date: 05/08/2023  Medication: atorvastatin (LIPITOR) 40 MG tablet  Has the patient contacted their pharmacy? Yes, they advised patient to contact ordering provider.  (Agent: If no, request that the patient contact the pharmacy for the refill. If patient does not wish to contact the pharmacy document the reason why and proceed with request.) (Agent: If yes, when and what did the pharmacy advise?)  Is this the correct pharmacy for this prescription? Yes If no, delete pharmacy and type the correct one.  This is the patient's preferred pharmacy:  [id number] [place], [place] - [address] AT [facility] [address]  Phone: [phone] Fax: [phone]  Has the prescription been filled recently? No  Is the patient out of the medication? Yes  Has the patient been seen for an appointment in the last year OR does the patient have an upcoming appointment? Yes  Can we respond through MyChart? Yes  Agent: Please be advised that Rx refills may take up to 3 business days. We ask that you follow-up with your pharmacy.

## 2024-04-26 ENCOUNTER — Other Ambulatory Visit: Payer: Self-pay | Admitting: Internal Medicine

## 2024-04-26 DIAGNOSIS — F3341 Major depressive disorder, recurrent, in partial remission: Secondary | ICD-10-CM
